# Patient Record
Sex: Female | Born: 1970 | Race: White | Hispanic: No | Smoking: Never smoker
Health system: Southern US, Community
[De-identification: ages and names within clinical notes are randomized; demographics above are authoritative.]

## PROBLEM LIST (undated history)

## (undated) DIAGNOSIS — M1632 Unilateral osteoarthritis resulting from hip dysplasia, left hip: Secondary | ICD-10-CM

## (undated) DIAGNOSIS — E7800 Pure hypercholesterolemia, unspecified: Secondary | ICD-10-CM

## (undated) DIAGNOSIS — M6740 Ganglion, unspecified site: Secondary | ICD-10-CM

## (undated) DIAGNOSIS — E119 Type 2 diabetes mellitus without complications: Secondary | ICD-10-CM

## (undated) DIAGNOSIS — J302 Other seasonal allergic rhinitis: Secondary | ICD-10-CM

## (undated) DIAGNOSIS — R030 Elevated blood-pressure reading, without diagnosis of hypertension: Secondary | ICD-10-CM

## (undated) DIAGNOSIS — M1990 Unspecified osteoarthritis, unspecified site: Secondary | ICD-10-CM

## (undated) DIAGNOSIS — M2550 Pain in unspecified joint: Secondary | ICD-10-CM

## (undated) DIAGNOSIS — B351 Tinea unguium: Secondary | ICD-10-CM

## (undated) DIAGNOSIS — I10 Essential (primary) hypertension: Secondary | ICD-10-CM

## (undated) DIAGNOSIS — M1631 Unilateral osteoarthritis resulting from hip dysplasia, right hip: Secondary | ICD-10-CM

## (undated) DIAGNOSIS — L989 Disorder of the skin and subcutaneous tissue, unspecified: Secondary | ICD-10-CM

## (undated) DIAGNOSIS — T7840XA Allergy, unspecified, initial encounter: Secondary | ICD-10-CM

## (undated) DIAGNOSIS — E282 Polycystic ovarian syndrome: Secondary | ICD-10-CM

## (undated) HISTORY — DX: Disorder of the skin and subcutaneous tissue, unspecified: L98.9

## (undated) HISTORY — DX: Pure hypercholesterolemia, unspecified: E78.00

## (undated) HISTORY — DX: Ganglion, unspecified site: M67.40

## (undated) HISTORY — DX: Polycystic ovarian syndrome: E28.2

## (undated) HISTORY — DX: Elevated blood-pressure reading, without diagnosis of hypertension: R03.0

## (undated) HISTORY — DX: Tinea unguium: B35.1

## (undated) HISTORY — PX: NO PAST SURGERIES: SHX2092

## (undated) HISTORY — DX: Pain in unspecified joint: M25.50

## (undated) HISTORY — PX: TONSILLECTOMY: SUR1361

---
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
## 2005-09-16 ENCOUNTER — Ambulatory Visit: Payer: Self-pay | Admitting: Family Medicine

## 2006-08-12 ENCOUNTER — Ambulatory Visit: Payer: Self-pay | Admitting: Family Medicine

## 2006-08-23 ENCOUNTER — Ambulatory Visit: Payer: Self-pay | Admitting: Family Medicine

## 2006-08-23 LAB — CONVERTED CEMR LAB
ALT: 24 units/L (ref 0–40)
AST: 20 units/L (ref 0–37)
Albumin: 3.8 g/dL (ref 3.5–5.2)
Alkaline Phosphatase: 38 units/L — ABNORMAL LOW (ref 39–117)
CO2: 30 meq/L (ref 19–32)
Cholesterol: 257 mg/dL (ref 0–200)
HDL: 34.4 mg/dL — ABNORMAL LOW (ref >39.0)
Total Bilirubin: 1.3 mg/dL — ABNORMAL HIGH (ref 0.3–1.2)
Total CHOL/HDL Ratio: 7.5
Total Protein: 6.4 g/dL (ref 6.0–8.3)
Triglycerides: 419 mg/dL (ref 0–149)

## 2006-10-15 ENCOUNTER — Ambulatory Visit (HOSPITAL_COMMUNITY): Admission: RE | Admit: 2006-10-15 | Discharge: 2006-10-15 | Payer: Self-pay | Admitting: Obstetrics and Gynecology

## 2006-10-15 ENCOUNTER — Encounter (INDEPENDENT_AMBULATORY_CARE_PROVIDER_SITE_OTHER): Payer: Self-pay | Admitting: Specialist

## 2006-12-10 ENCOUNTER — Ambulatory Visit: Payer: Self-pay | Admitting: Family Medicine

## 2006-12-10 DIAGNOSIS — E7800 Pure hypercholesterolemia, unspecified: Secondary | ICD-10-CM

## 2007-01-20 ENCOUNTER — Ambulatory Visit: Payer: Self-pay | Admitting: Family Medicine

## 2007-01-20 LAB — CONVERTED CEMR LAB
Glucose, Urine, Semiquant: NEGATIVE
Protein, U semiquant: NEGATIVE
Specific Gravity, Urine: 1.02
Urobilinogen, UA: 0.2
pH: 6.5

## 2007-01-21 ENCOUNTER — Encounter: Payer: Self-pay | Admitting: Family Medicine

## 2007-01-24 LAB — CONVERTED CEMR LAB
Cholesterol: 266 mg/dL (ref 0–200)
Direct LDL: 115.1 mg/dL
Total CHOL/HDL Ratio: 7.8
VLDL: 104 mg/dL — ABNORMAL HIGH (ref 0–40)

## 2007-10-10 ENCOUNTER — Telehealth: Payer: Self-pay | Admitting: Family Medicine

## 2008-09-20 LAB — CONVERTED CEMR LAB
Pap Smear: NORMAL
Pap Smear: NORMAL

## 2009-01-29 ENCOUNTER — Ambulatory Visit: Payer: Self-pay | Admitting: Family Medicine

## 2009-01-29 DIAGNOSIS — M6740 Ganglion, unspecified site: Secondary | ICD-10-CM | POA: Insufficient documentation

## 2009-01-29 DIAGNOSIS — L989 Disorder of the skin and subcutaneous tissue, unspecified: Secondary | ICD-10-CM | POA: Insufficient documentation

## 2009-01-29 DIAGNOSIS — B351 Tinea unguium: Secondary | ICD-10-CM | POA: Insufficient documentation

## 2009-01-31 ENCOUNTER — Telehealth: Payer: Self-pay | Admitting: Family Medicine

## 2009-02-12 ENCOUNTER — Telehealth: Payer: Self-pay | Admitting: Family Medicine

## 2009-03-13 ENCOUNTER — Encounter: Payer: Self-pay | Admitting: Family Medicine

## 2009-04-25 ENCOUNTER — Encounter: Admission: RE | Admit: 2009-04-25 | Discharge: 2009-04-25 | Payer: Self-pay | Admitting: Obstetrics and Gynecology

## 2009-05-09 ENCOUNTER — Ambulatory Visit: Payer: Self-pay | Admitting: Family Medicine

## 2009-05-09 DIAGNOSIS — M2550 Pain in unspecified joint: Secondary | ICD-10-CM

## 2009-05-15 LAB — CONVERTED CEMR LAB
ALT: 19 units/L (ref 0–35)
Albumin: 4.1 g/dL (ref 3.5–5.2)
Alkaline Phosphatase: 47 units/L (ref 39–117)
Bilirubin, Direct: 0.1 mg/dL (ref 0.0–0.3)
CO2: 29 meq/L (ref 19–32)
Calcium: 8.7 mg/dL (ref 8.4–10.5)
Chloride: 103 meq/L (ref 96–112)
Creatinine, Ser: 0.7 mg/dL (ref 0.4–1.2)
Direct LDL: 142.8 mg/dL
Glucose, Bld: 85 mg/dL (ref 70–99)
Rhuematoid fact SerPl-aCnc: 20 intl units/mL (ref 0.0–20.0)
Sodium: 139 meq/L (ref 135–145)
Total Protein: 6.6 g/dL (ref 6.0–8.3)

## 2009-12-04 ENCOUNTER — Encounter (INDEPENDENT_AMBULATORY_CARE_PROVIDER_SITE_OTHER): Payer: Self-pay | Admitting: *Deleted

## 2009-12-27 ENCOUNTER — Ambulatory Visit: Payer: Self-pay | Admitting: Family Medicine

## 2010-01-02 LAB — CONVERTED CEMR LAB
Cholesterol: 255 mg/dL — ABNORMAL HIGH (ref 0–200)
HDL: 36.5 mg/dL — ABNORMAL LOW (ref >39.00)
Total CHOL/HDL Ratio: 7
VLDL: 134.4 mg/dL — ABNORMAL HIGH (ref 0.0–40.0)

## 2010-04-22 LAB — HM MAMMOGRAPHY: HM Mammogram: NORMAL

## 2010-05-20 ENCOUNTER — Ambulatory Visit: Payer: Self-pay | Admitting: Family Medicine

## 2010-05-20 DIAGNOSIS — M25519 Pain in unspecified shoulder: Secondary | ICD-10-CM

## 2010-05-20 DIAGNOSIS — M25511 Pain in right shoulder: Secondary | ICD-10-CM | POA: Insufficient documentation

## 2010-05-20 DIAGNOSIS — I10 Essential (primary) hypertension: Secondary | ICD-10-CM

## 2010-05-20 DIAGNOSIS — J069 Acute upper respiratory infection, unspecified: Secondary | ICD-10-CM | POA: Insufficient documentation

## 2010-07-22 NOTE — Assessment & Plan Note (Signed)
Summary: CPX/JRR   Vital Signs:  Patient profile:   40 year old female Height:      70 inches Weight:      290.4 pounds BMI:     41.82 Temp:     98.5 degrees F oral Pulse rate:   76 / minute Pulse rhythm:   regular BP sitting:   140 / 100  (left arm) Cuff size:   large  Vitals Entered By: Benny Lennert CMA Duncan Dull) (May 20, 2010 11:23 AM)  History of Present Illness: Chief complaint chronic health management.  The patient is here for annual wellness exam and preventative care.    BP very elevated today..previously well controlled. BP was elevated when saw her OB for her CPX.  Does not check at home. Has been trying to eat better in last year..no weight loss. Walk 3 times a week.  Adopted ..so no known Fhx of HTN  IN last day started facial pressure, nasal discharge, no fever. No ST.  No cough., no SOB.  Family with illness as well.  This AM headache..none prior..no dizziness. Not using any OTC med. Not taking allergies medicaitons.   Continues to have joint pain this year... hips and upper neck pain.  Most bothersome in right shoulder... began in last 3 months. Pain most with abduction, no sig pain wih int and ext rotation. Has history of neck injury in high school... neck stiffness...ever since.  No numbness, tingling in right arm. Using daily ibuprofen.   Last year basic rheum panel was neg.  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  Lipid Management History:      Positive NCEP/ATP III risk factors include HDL cholesterol less than 40.  Negative NCEP/ATP III risk factors include female age less than 46 years old and non-tobacco-user status.        The patient expresses understanding of adjunctive measures for cholesterol lowering.  Adjunctive measures started by the patient include aerobic exercise, omega-3 supplements, limit alcohol consumpton, and weight reduction.  Comments: L Improved from last year.   Triglycerides worse. .   Preventive Screening-Counseling &  Management  Alcohol-Tobacco     Smoking Status: never  Caffeine-Diet-Exercise     Does Patient Exercise: yes  Problems Prior to Update: 1)  Pain in Joint, Multiple Sites  (ICD-719.49) 2)  Dermatophytosis of Nail  (ICD-110.1) 3)  Ganglion of Tendon Sheath  (ICD-727.42) 4)  Skin Lesion  (ICD-709.9) 5)  Hypercholesterolemia, Pure  (ICD-272.0)  Current Medications (verified): 1)  Fenofibrate 160 Mg Tabs (Fenofibrate) .... Take 1 Tablet By Mouth Once A Day  Allergies (verified): No Known Drug Allergies  Past History:  Past medical, surgical, family and social histories (including risk factors) reviewed, and no changes noted (except as noted below).  Family History: Reviewed history and no changes required. Adopted.   Social History: Reviewed history and no changes required. Never Smoked Alcohol use-no Regular exercise-yes Smoking Status:  never Does Patient Exercise:  yes  Review of Systems General:  Denies fatigue and fever. CV:  Denies chest pain or discomfort. Resp:  Denies shortness of breath. GI:  Denies abdominal pain. GU:  Denies dysuria.  Physical Exam  General:  obese appearing female inNAd  Head:  frontal ttp, no maxillary sinus ttp Eyes:  No corneal or conjunctival inflammation noted. EOMI. Perrla. Funduscopic exam benign, without hemorrhages, exudates or papilledema. Vision grossly normal. Ears:  clear fluid B TMs  Nose:  nasal discharge, no mucosal pallor.   Mouth:  MMM Neck:  no carotid  bruit or thyromegaly no cervical or supraclavicular lymphadenopathy   decrease lateral motion..no vertebral ttp  neg Spurling Lungs:  Normal respiratory effort, chest expands symmetrically. Lungs are clear to auscultation, no crackles or wheezes. Heart:  Normal rate and regular rhythm. S1 and S2 normal without gallop, murmur, click, rub or other extra sounds. Abdomen:  Bowel sounds positive,abdomen soft and non-tender without masses, organomegaly or hernias  noted. Msk:  Right shoulder.. latearl ttp over bursa, pain with passive and active abduction, noe with other movements NEg drop arm test, neg impingement sign  Fulll ROM in B hips..no current pain.  Pulses:  R and L posterior tibial pulses are full and equal bilaterally  Extremities:  No clubbing, cyanosis, edema, or deformity noted in hands, wrists, ankles  Neurologic:  No cranial nerve deficits noted. Station and gait are normal. Plantar reflexes are down-going bilaterally. DTRs are symmetrical throughout. Sensory, motor and coordinative functions appear intact.   Impression & Recommendations:  Problem # 1:  ELEVATED BLOOD PRESSURE WITHOUT DIAGNOSIS OF HYPERTENSION (ICD-796.2) Follow BPs at home. Follow up in 2-4 weeks with measurements. Likely needs work up and [employer]. Encouraged exercise, weight loss, healthy eating habits.   Problem # 2:  SHOULDER PAIN, RIGHT (ICD-719.41) Likely due to bursitis...treat with NSAID, ice massage and ROM exercises.   Problem # 3:  PAIN IN JOINT, MULTIPLE SITES (ICD-719.49) LAst year neg RF... pain is in larger jints..not characteristic of RA. More likely arthrits in neck due to past injury and arthritis in hips due to obesity.  Trial of diclofenac as above.   Problem # 4:  URI (ICD-465.9)  Instructed on symptomatic treatment. Call if symptoms persist or worsen.   No celar sing of bacterial sinusitis.   Problem # 5:  HYPERCHOLESTEROLEMIA, PURE (ICD-272.0) LDL improved by triglycerides dangerously high.. on fenofibrate.Marland Kitchenadd fish oil. Diet and lifestyle changes. Recheck in 3 months.  Her updated medication list for this problem includes:    Fenofibrate 160 Mg Tabs (Fenofibrate) .Marland Kitchen... Take 1 tablet by mouth once a day  Complete Medication List: 1)  Fenofibrate 160 Mg Tabs (Fenofibrate) .... Take 1 tablet by mouth once a day  Other Orders: Admin 1st Vaccine ([address]) Flu Vaccine 70yrs + (60454)  Lipid Assessment/Plan:      Based on NCEP/ATP III,  the patient's risk factor category is "0-1 risk factors".  The patient's lipid goals are as follows: Total cholesterol goal is 200; LDL cholesterol goal is 160; HDL cholesterol goal is 40; Triglyceride goal is 150.  Her LDL cholesterol goal has been met.    Patient Instructions: 1)  Follow BP at home.. record measurements.Marland Kitchengoal <140/90. 2)  Follow up appt for BP  appt in 2-4 weeks.  3)   In meantime..work on weight loss, low salt diet, increase exercise. 4)   Fish oil 2000 mg divided daily...EPA and DHA. 5)  Return in 3 months for lipids Dx 272.0. 6)   Stop ibuprofen.  7)  Start diclofenac 75 mg by mouth two times a day. 8)   Gentle exercsie of right shoulder. 9)   Ice massage of right shoulder.  10)  Take OTC..mucinex , no decongestant 11)   Start back on nasal saline irrigation. 12)  Restart claritin daily.   Orders Added: 1)  Admin 1st Vaccine [90471] 2)  Flu Vaccine 40yrs + [90658] 3)  Est. Patient Level IV [09811]    Current Allergies (reviewed today): No known allergies  Flu Vaccine Consent Questions     Do you have a history of severe allergic reactions to this vaccine? no    Any prior history of allergic reactions to egg and/or gelatin? no    Do you have a sensitivity to the preservative Thimersol? no    Do you have a past history of Guillan-Barre Syndrome? no    Do you currently have an acute febrile illness? no    Have you ever had a severe reaction to latex? no    Vaccine information given and explained to patient? yes    Are you currently pregnant? no    Lot Number:[id number]   Exp Date:12/20/2010   Site Given  Left Deltoid IM .lbflu1 Last Flu Vaccine:  given (05/09/2009 9:29:43 AM) Flu Vaccine Result Date:  05/20/2010 Flu Vaccine Result:  given Flu Vaccine Next Due:  1 yr Last PAP:  normal (09/20/2008 10:38:34 AM) PAP Result Date:  04/19/2010 PAP Result:  normal PAP Next Due:  1 yr Last Mammogram:  normal (04/22/2009 10:38:34  AM) Mammogram Result Date:  04/22/2010 Mammogram Result:  normal Mammogram Next Due:  1 yr  Appended Document: Orders Update Medications Added FISH OIL 1000 MG CAPS (OMEGA-3 FATTY ACIDS) 1 tab by mouth two times a day DICLOFENAC SODIUM 75 MG TBEC (DICLOFENAC SODIUM) 1 tab by mouth two times a day as needed joint pain.          Clinical Lists Changes  Medications: Added new medication of FISH OIL 1000 MG CAPS (OMEGA-3 FATTY ACIDS) 1 tab by mouth two times a day Added new medication of DICLOFENAC SODIUM 75 MG TBEC (DICLOFENAC SODIUM) 1 tab by mouth two times a day as needed joint pain. - Signed Rx of DICLOFENAC SODIUM 75 MG TBEC (DICLOFENAC SODIUM) 1 tab by mouth two times a day as needed joint pain.;  #30 x 0;  Signed;  Entered by: Kerby Nora MD;  Authorized by: Kerby Nora MD;  Method used: Electronically to [id number] [address], [place], [place]  [address], Ph: [phone], Fax: [phone]    Prescriptions: DICLOFENAC SODIUM 75 MG TBEC (DICLOFENAC SODIUM) 1 tab by mouth two times a day as needed joint pain.  #30 x 0   Entered and Authorized by:   Kerby Nora MD   Signed by:   Kerby Nora MD on 05/20/2010   Method used:   Electronically to        [id number] (retail)       [address]       [place], [place]  [address]       Ph: [phone]       Fax: [phone]   RxID:   [id number]

## 2010-07-22 NOTE — Letter (Signed)
Summary: [place] No Show Letter  [place] [facility]  [address] [place], [place] [address]   Phone: [phone]  Fax: [phone]    12/04/2009 MRN: [id number]  [facility] [address] [place], [place]  [address]   Dear Kathy Mccarthy,   Our records indicate that you missed your scheduled appointment with __Lab__________________ on __6.15.11__________.  Please contact this office to reschedule your appointment as soon as possible.  It is important that you keep your scheduled appointments with your physician, so we can provide you the best care possible.  Please be advised that there may be a charge for "no show" appointments.    Sincerely,   [place] [facility]

## 2010-08-20 ENCOUNTER — Telehealth: Payer: Self-pay | Admitting: Family Medicine

## 2010-08-28 NOTE — Progress Notes (Signed)
Summary: diclofenac  Phone Note Refill Request Message from:  [employer] on August 20, 2010 3:12 PM  Refills Requested: Medication #1:  DICLOFENAC SODIUM 75 MG TBEC 1 tab by mouth two times a day as needed joint pain..   Supply Requested: 1 month [facility] [address]   Method Requested: Electronic Initial call taken by: Benny Lennert CMA (AAMA),  August 20, 2010 3:12 PM    Prescriptions: DICLOFENAC SODIUM 75 MG TBEC (DICLOFENAC SODIUM) 1 tab by mouth two times a day as needed joint pain.  #30 Tablet x 0   Entered and Authorized by:   Kerby Nora MD   Signed by:   Kerby Nora MD on 08/22/2010   Method used:   Electronically to        [id number] ([facility]       [address]       [place], [place]  [address]       Ph: [phone]       Fax: [phone]   RxID:   [id number]

## 2010-11-07 NOTE — Op Note (Signed)
NAMEBELISSA, Kathy Mccarthy                 ACCOUNT NO.:  [id number]   MEDICAL RECORD NO.:  [id number]          PATIENT TYPE:  AMB   LOCATION:  SDC                           FACILITY:  [facility]   PHYSICIAN:  Zelphia Cairo, MD    DATE OF BIRTH:  15-Jul-1970   DATE OF PROCEDURE:  10/15/2006  DATE OF DISCHARGE:                               OPERATIVE REPORT   PREOPERATIVE DIAGNOSIS:  Atypical glandular cells of undetermined  significance.   POSTOPERATIVE DIAGNOSIS:  Atypical glandular cells of undetermined  significance.   PROCEDURE:  IUD removal, colposcopy, endocervical curettage,  hysteroscopy, with D&C.   SURGEON:  Zelphia Cairo, M.D.   ASSISTANT:  None.   ANESTHESIA:  General.   SPECIMENS:  1. ECC.  2. EMC.   ESTIMATED BLOOD LOSS:  Minimal.   COMPLICATIONS:  None.   CONDITION:  Stable to recovery room.   PROCEDURE:  The patient was taken to the operating room, where general  anesthesia was obtained. She was placed in the dorsal lithotomy position  using Allen stirrups. A bivalve speculum was placed in the vagina, and a  ring forceps was used to grasp the IUD which was then removed.  The  cervix was then covered in acetic acid.  Colposcope  was then focused.  No acetowhite changes were noted.  An endocervical curettage was then  performed.   The speculum was then removed. The patient was prepped and draped in a  sterile fashion. Speculum was placed in the vagina, and a single-tooth  tenaculum was used to grasp the anterior lip of the cervix.  The cervix  was easily dilated using Pratt dilators.  The hysteroscope was then  inserted, and the uterine cavity was visualized.  The left ostia  appeared normal.  The right ostia could not be visualized.  The fundus  of the uterus appeared to have some opaque stippling, otherwise appeared  normal.  There was a small polyp noted on the anterior uterine wall.  The hysteroscope  was then removed.  A curettage was then performed.  One  percent  Xylocaine was then used to provide local anesthesia on the cervix.  The  single-tooth tenaculum and speculum were removed, and the patient was  taken to the recovery room in stable condition.      Zelphia Cairo, MD  Electronically Signed     GA/MEDQ  D:  10/15/2006  T:  10/15/2006  Job:  [id number]

## 2011-03-12 ENCOUNTER — Encounter: Payer: Self-pay | Admitting: Family Medicine

## 2011-03-13 ENCOUNTER — Encounter: Payer: Self-pay | Admitting: Family Medicine

## 2011-03-13 ENCOUNTER — Ambulatory Visit (INDEPENDENT_AMBULATORY_CARE_PROVIDER_SITE_OTHER): Payer: Managed Care, Other (non HMO) | Admitting: Family Medicine

## 2011-03-13 VITALS — BP 180/130 | HR 68 | Temp 98.2°F | Ht 69.0 in | Wt 277.2 lb

## 2011-03-13 DIAGNOSIS — I10 Essential (primary) hypertension: Secondary | ICD-10-CM

## 2011-03-13 LAB — CBC WITH DIFFERENTIAL/PLATELET
Basophils Relative: 0.7 % (ref 0.0–3.0)
Eosinophils Relative: 1.5 % (ref 0.0–5.0)
HCT: 45.2 % (ref 36.0–46.0)
Hemoglobin: 15.2 g/dL — ABNORMAL HIGH (ref 12.0–15.0)
MCV: 92.7 fl (ref 78.0–100.0)
Monocytes Absolute: 0.3 10*3/uL (ref 0.1–1.0)
Neutro Abs: 3 10*3/uL (ref 1.4–7.7)
Neutrophils Relative %: 58.9 % (ref 43.0–77.0)
RBC: 4.88 Mil/uL (ref 3.87–5.11)
WBC: 5.1 10*3/uL (ref 4.5–10.5)

## 2011-03-13 LAB — LIPID PANEL
Cholesterol: 255 mg/dL — ABNORMAL HIGH (ref 0–200)
HDL: 38.8 mg/dL — ABNORMAL LOW (ref >39.00)
Total CHOL/HDL Ratio: 7
Triglycerides: 307 mg/dL — ABNORMAL HIGH (ref 0.0–149.0)
VLDL: 61.4 mg/dL — ABNORMAL HIGH (ref 0.0–40.0)

## 2011-03-13 LAB — COMPREHENSIVE METABOLIC PANEL
AST: 22 U/L (ref 0–37)
Alkaline Phosphatase: 46 U/L (ref 39–117)
BUN: 12 mg/dL (ref 6–23)
Calcium: 9 mg/dL (ref 8.4–10.5)
Creatinine, Ser: 0.8 mg/dL (ref 0.4–1.2)
Total Bilirubin: 1.5 mg/dL — ABNORMAL HIGH (ref 0.3–1.2)

## 2011-03-13 MED ORDER — CHLORTHALIDONE 25 MG PO TABS: 25.0000 mg | ORAL_TABLET | Freq: Every day | ORAL | Status: DC

## 2011-03-13 NOTE — Patient Instructions (Addendum)
Your blood pressure is too high! Start blood pressure medicine called chlorthalidone 25mg  daily. Blood work today, EKG today. Return in 2 weeks for recheck blood pressure with myself or Dr. Leonard Schwartz. Limit salt intake, more potassium (fruits and vegetables). Get plenty of water to drink.

## 2011-03-13 NOTE — Progress Notes (Signed)
  Subjective:    Patient ID: Kathy Mccarthy, female    DOB: Nov 12, 1970, 40 y.o.   MRN: [id number]  HPI CC: feeling bad  1 wk h/o feeling ill, feels blood pressure elevated.  Has wave of nausea, mild dull HA, pulsing body when sitting down resting.  Lots of stress recently.  Feels "vision closing in" when watching TV, not otherwise.  Not sleeping well (very unusual for pt).  + h/o allergies, recent flare.  Decreased appetite.  Did have HTN ~15 yrs ago, doesn't remember what med she was on.  No fevers/chills, chest pain or tightness, SOB, leg swelling.  No floaters or flashing lights.  No eye pain or redness.  No dizziness.  No recent cold sxs, cough, abd pain, vomiting, blood in stool or urine, BM changes.  Mammogram oct 2011 normal.  CPE here 04/2010.  Had elevated bp then as well.  Fasting today.  BP Readings from Last 3 Encounters:  03/13/11 160/108  05/20/10 140/100  05/09/09 120/78    Wt Readings from Last 3 Encounters:  03/13/11 277 lb 4 oz (125.76 kg)  05/20/10 290 lb 6.4 oz (131.725 kg)  05/09/09 288 lb 6.4 oz (130.817 kg)  unsure if weight loss recent or over long period of time.  Review of Systems Per HPI    Objective:   Physical Exam  Nursing note and vitals reviewed. Constitutional: She appears well-developed and well-nourished. No distress.  HENT:  Head: Normocephalic and atraumatic.  Mouth/Throat: Oropharynx is clear and moist. No oropharyngeal exudate.  Eyes: Conjunctivae and EOM are normal. Pupils are equal, round, and reactive to light. No scleral icterus.  Neck: Normal range of motion. Neck supple. Carotid bruit is not present.  Cardiovascular: Normal rate, regular rhythm, normal heart sounds and intact distal pulses.   No murmur heard. Pulmonary/Chest: Effort normal and breath sounds normal. No respiratory distress. She has no wheezes. She has no rales.  Abdominal: Soft. Bowel sounds are normal. She exhibits no distension and no mass. There is no tenderness. There  is no rebound and no guarding.       No abd/renal bruit  Lymphadenopathy:    She has no cervical adenopathy.  Skin: Skin is warm and dry. No rash noted.  Psychiatric: She has a normal mood and affect.          Assessment & Plan:

## 2011-03-14 NOTE — Assessment & Plan Note (Signed)
Hypertensive urgency. EKG - NSR.   LAD.  No ST/T changes, normal intervals. Check blood work today, start chlorthalidone 25mg  daily.   Given elevation, return in 2 wks for f/u. Discussed red flags to seek urgent care.

## 2011-03-27 ENCOUNTER — Ambulatory Visit (INDEPENDENT_AMBULATORY_CARE_PROVIDER_SITE_OTHER): Payer: 59 | Admitting: Family Medicine

## 2011-03-27 ENCOUNTER — Encounter: Payer: Self-pay | Admitting: Family Medicine

## 2011-03-27 VITALS — BP 140/80 | HR 92 | Temp 98.7°F | Ht 69.0 in | Wt 278.1 lb

## 2011-03-27 DIAGNOSIS — R7309 Other abnormal glucose: Secondary | ICD-10-CM

## 2011-03-27 DIAGNOSIS — E7800 Pure hypercholesterolemia, unspecified: Secondary | ICD-10-CM

## 2011-03-27 DIAGNOSIS — Z23 Encounter for immunization: Secondary | ICD-10-CM

## 2011-03-27 DIAGNOSIS — I10 Essential (primary) hypertension: Secondary | ICD-10-CM

## 2011-03-27 DIAGNOSIS — R7303 Prediabetes: Secondary | ICD-10-CM

## 2011-03-27 MED ORDER — CHLORTHALIDONE 25 MG PO TABS: 50.0000 mg | ORAL_TABLET | Freq: Every day | ORAL | Status: DC

## 2011-03-27 NOTE — Assessment & Plan Note (Signed)
Decrease carbs and portion size. Info given.

## 2011-03-27 NOTE — Assessment & Plan Note (Signed)
Improved control, but not at goal. Increase chlorthalidone to 50 mg daily. Lab eval was nml. Encouraged exercise, weight loss, healthy eating habits.

## 2011-03-27 NOTE — Patient Instructions (Addendum)
Schedule CPX in 3 months with fasting labs in 3 months as well. Work on starting exercise ... Goal 3-5 times a week. Increase omega 3 fatty acid: DHA, ALA, EPA...3000-4000 mg divided daily.  Work on low cholesterol diet per [employer]. Increase chlorthalidone to 50 mg daily.Molli Knock to split dose if needed as long as no increase in bedtime urination.

## 2011-03-27 NOTE — Assessment & Plan Note (Addendum)
Poor control... She will work aggressively on lifestyle changes, info given... Increase fish oil. Recehck in 3 months. May need med at that time.

## 2011-03-27 NOTE — Progress Notes (Signed)
  Subjective:    Patient ID: Kathy Mccarthy, female    DOB: Nov 20, 1970, 40 y.o.   MRN: [id number]  HPI  39 year old female  Seen 2 weeks ago by Dr. Reece Agar for Hypertensive urgency.  Was feeling overall ill. EKG - NSR. LAD. No ST/T changes, normal intervals.  Cbc, tsh, CMET  nml except cholesterol was high, started chlorthalidone 25mg  daily.   Hypertension:  Still feling bad in PM, feels great during end of day. Using medication without problems or lightheadedness: No  Chest pain with exertion:None Edema:None Short of breath:None Average home BPs: not checking at home. Other issues:  Elevated Cholesterol: not on any medication at this time. LDL not at goal <130, trig and HDL not at goal. Diet compliance:Portion control issues, loves carbs, likes veggies Exercise:None Other complaints:  Prediabetes: poor lifestyle.     Review of Systems  Constitutional: Negative for fever and fatigue.  HENT: Negative for ear pain.   Eyes: Negative for pain.  Respiratory: Negative for chest tightness and shortness of breath.   Cardiovascular: Negative for chest pain, palpitations and leg swelling.  Gastrointestinal: Negative for abdominal pain.  Genitourinary: Negative for dysuria.       Objective:   Physical Exam  Constitutional: Vital signs are normal. She appears well-developed and well-nourished. She is cooperative.  Non-toxic appearance. She does not appear ill. No distress.       obese  HENT:  Head: Normocephalic.  Right Ear: Hearing, tympanic membrane, external ear and ear canal normal. Tympanic membrane is not erythematous, not retracted and not bulging.  Left Ear: Hearing, tympanic membrane, external ear and ear canal normal. Tympanic membrane is not erythematous, not retracted and not bulging.  Nose: No mucosal edema or rhinorrhea. Right sinus exhibits no maxillary sinus tenderness and no frontal sinus tenderness. Left sinus exhibits no maxillary sinus tenderness and no frontal sinus  tenderness.  Mouth/Throat: Uvula is midline, oropharynx is clear and moist and mucous membranes are normal.  Eyes: Conjunctivae, EOM and lids are normal. Pupils are equal, round, and reactive to light. No foreign bodies found.  Neck: Trachea normal and normal range of motion. Neck supple. Carotid bruit is not present. No mass and no thyromegaly present.  Cardiovascular: Normal rate, regular rhythm, S1 normal, S2 normal, normal heart sounds, intact distal pulses and normal pulses.  Exam reveals no gallop and no friction rub.   No murmur heard. Pulmonary/Chest: Effort normal and breath sounds normal. Not tachypneic. No respiratory distress. She has no decreased breath sounds. She has no wheezes. She has no rhonchi. She has no rales.  Abdominal: Soft. Normal appearance and bowel sounds are normal. There is no tenderness.  Neurological: She is alert.  Skin: Skin is warm, dry and intact. No rash noted.  Psychiatric: Her speech is normal and behavior is normal. Judgment and thought content normal. Her mood appears not anxious. Cognition and memory are normal. She does not exhibit a depressed mood.          Assessment & Plan:

## 2011-08-03 ENCOUNTER — Other Ambulatory Visit: Payer: Self-pay | Admitting: Family Medicine

## 2011-11-17 ENCOUNTER — Other Ambulatory Visit: Payer: Self-pay | Admitting: Family Medicine

## 2012-05-04 ENCOUNTER — Other Ambulatory Visit: Payer: Self-pay | Admitting: Family Medicine

## 2012-05-05 NOTE — Telephone Encounter (Signed)
Pt due for appt CPX vs follow up. Refill until then.

## 2012-05-06 ENCOUNTER — Other Ambulatory Visit: Payer: Self-pay | Admitting: *Deleted

## 2012-05-06 MED ORDER — CHLORTHALIDONE 25 MG PO TABS: 50.0000 mg | ORAL_TABLET | Freq: Every day | ORAL | Status: DC

## 2012-05-06 NOTE — Telephone Encounter (Signed)
Pt out of med; pt request call back [phone] re: refill.

## 2012-08-12 ENCOUNTER — Ambulatory Visit (INDEPENDENT_AMBULATORY_CARE_PROVIDER_SITE_OTHER): Payer: PRIVATE HEALTH INSURANCE | Admitting: Family Medicine

## 2012-08-12 ENCOUNTER — Encounter: Payer: Self-pay | Admitting: Family Medicine

## 2012-08-12 VITALS — BP 140/90 | HR 89 | Temp 98.4°F | Ht 69.0 in | Wt 294.5 lb

## 2012-08-12 DIAGNOSIS — I10 Essential (primary) hypertension: Secondary | ICD-10-CM

## 2012-08-12 MED ORDER — CHLORTHALIDONE 25 MG PO TABS: 50.0000 mg | ORAL_TABLET | Freq: Every day | ORAL | Status: DC

## 2012-08-12 NOTE — Assessment & Plan Note (Signed)
Due for re-eval. 

## 2012-08-12 NOTE — Progress Notes (Signed)
  Subjective:    Patient ID: Kathy Mccarthy, female    DOB: 1970-09-26, 42 y.o.   MRN: [id number]  HPI Hypertension:  Poor control today on chlorthalidone. Using medication without problems or lightheadedness: None Chest pain with exertion: None Edema:None Short of breath:None Average home BPs: Increased stress in last 6 weeks.. 130/90 Other issues:  Prediabetes:  Elevated Cholesterol:  Due for re-eval. Lab Results  Component Value Date   CHOL 255* 03/13/2011   HDL 38.80* 03/13/2011   LDLDIRECT 151.9 03/13/2011   TRIG 307.0* 03/13/2011   CHOLHDL 7 03/13/2011   Diet compliance: Moderate Exercise: 3 times a week, walking. Wt Readings from Last 3 Encounters:  08/12/12 294 lb 8 oz (133.584 kg)  03/27/11 278 lb 1.9 oz (126.154 kg)  03/13/11 277 lb 4 oz (125.76 kg)    Other complaints:  Sinus pressure and congestion x 1 day. No fever.   No cough. Post nasal drip.   Last CPX in 01/2012... Pa nml, mammogram nml  Uptodate with vaccines.    Review of Systems  Constitutional: Negative for fever and fatigue.  HENT: Positive for congestion and postnasal drip.   Respiratory: Negative for cough and shortness of breath.   Cardiovascular: Negative for chest pain and leg swelling.  Gastrointestinal: Negative for abdominal pain.  Genitourinary: Negative for dysuria.       Objective:   Physical Exam  Constitutional: Vital signs are normal. She appears well-developed and well-nourished. She is cooperative.  Non-toxic appearance. She does not appear ill. No distress.  obese  HENT:  Head: Normocephalic.  Right Ear: Hearing, tympanic membrane, external ear and ear canal normal. Tympanic membrane is not erythematous, not retracted and not bulging.  Left Ear: Hearing, tympanic membrane, external ear and ear canal normal. Tympanic membrane is not erythematous, not retracted and not bulging.  Nose: Mucosal edema and rhinorrhea present. Right sinus exhibits no maxillary sinus tenderness and no  frontal sinus tenderness. Left sinus exhibits no maxillary sinus tenderness and no frontal sinus tenderness.  Mouth/Throat: Uvula is midline, oropharynx is clear and moist and mucous membranes are normal.  Eyes: Conjunctivae, EOM and lids are normal. Pupils are equal, round, and reactive to light. No foreign bodies found.  Neck: Trachea normal and normal range of motion. Neck supple. Carotid bruit is not present. No mass and no thyromegaly present.  Cardiovascular: Normal rate, regular rhythm, S1 normal, S2 normal, normal heart sounds, intact distal pulses and normal pulses.  Exam reveals no gallop and no friction rub.   No murmur heard. Pulmonary/Chest: Effort normal and breath sounds normal. Not tachypneic. No respiratory distress. She has no decreased breath sounds. She has no wheezes. She has no rhonchi. She has no rales.  Abdominal: Soft. Normal appearance and bowel sounds are normal. There is no tenderness.  Neurological: She is alert.  Skin: Skin is warm, dry and intact. No rash noted.  Psychiatric: Her speech is normal and behavior is normal. Judgment and thought content normal. Her mood appears not anxious. Cognition and memory are normal. She does not exhibit a depressed mood.          Assessment & Plan:

## 2012-08-12 NOTE — Patient Instructions (Addendum)
Follow BP at home, get a cuff. Call in 2 week with BP measurement to determine if we need to increase medication. Goal < 140/90.  Work on stress redfuction. For sinus symptoms... mucinex and nasal saline irrigation.  Work on  exercise, weight loss, healthy eating habits. Return for fasting labs in the next few weeks.

## 2012-08-12 NOTE — Assessment & Plan Note (Signed)
Borderline control. Encouraged exercise, weight loss, healthy eating habits. Follow BPs at home.   Contnue current dose of chlorthatlidone Call with measurements in 2 weeks.

## 2012-09-09 ENCOUNTER — Other Ambulatory Visit: Payer: Self-pay | Admitting: Family Medicine

## 2012-09-14 ENCOUNTER — Other Ambulatory Visit: Payer: PRIVATE HEALTH INSURANCE

## 2012-09-27 ENCOUNTER — Other Ambulatory Visit: Payer: PRIVATE HEALTH INSURANCE

## 2013-02-10 ENCOUNTER — Ambulatory Visit: Payer: PRIVATE HEALTH INSURANCE | Admitting: Family Medicine

## 2013-04-02 ENCOUNTER — Other Ambulatory Visit: Payer: Self-pay | Admitting: Family Medicine

## 2013-05-05 ENCOUNTER — Ambulatory Visit: Payer: PRIVATE HEALTH INSURANCE | Admitting: Family Medicine

## 2013-05-25 ENCOUNTER — Ambulatory Visit: Payer: PRIVATE HEALTH INSURANCE | Admitting: Family Medicine

## 2013-06-06 ENCOUNTER — Encounter: Payer: Self-pay | Admitting: Family Medicine

## 2013-06-06 ENCOUNTER — Ambulatory Visit (INDEPENDENT_AMBULATORY_CARE_PROVIDER_SITE_OTHER): Payer: 59 | Admitting: Family Medicine

## 2013-06-06 VITALS — BP 150/100 | HR 89 | Temp 98.6°F | Ht 69.0 in | Wt 306.0 lb

## 2013-06-06 DIAGNOSIS — E7800 Pure hypercholesterolemia, unspecified: Secondary | ICD-10-CM

## 2013-06-06 DIAGNOSIS — I10 Essential (primary) hypertension: Secondary | ICD-10-CM

## 2013-06-06 DIAGNOSIS — Z23 Encounter for immunization: Secondary | ICD-10-CM

## 2013-06-06 DIAGNOSIS — R7309 Other abnormal glucose: Secondary | ICD-10-CM

## 2013-06-06 DIAGNOSIS — R7303 Prediabetes: Secondary | ICD-10-CM

## 2013-06-06 MED ORDER — LOSARTAN POTASSIUM-HCTZ 50-12.5 MG PO TABS: 1.0000 | ORAL_TABLET | Freq: Every day | ORAL | Status: DC

## 2013-06-06 NOTE — Assessment & Plan Note (Signed)
Stop chlorthalidone.. change to losartan/HCTZ. Eval risk factors and end organ damage.  Follow BP at home. Follow up in 2 weeks.. Likely will need to increase medication dose.

## 2013-06-06 NOTE — Progress Notes (Signed)
Pre-visit discussion using our clinic review tool. No additional management support is needed unless otherwise documented below in the visit note.  

## 2013-06-06 NOTE — Addendum Note (Signed)
Addended by: Damita Lack on: 06/06/2013 09:57 AM   Modules accepted: Orders

## 2013-06-06 NOTE — Assessment & Plan Note (Signed)
Due for re-eval. 

## 2013-06-06 NOTE — Patient Instructions (Addendum)
Stop chlorthalidone. Start losartan/HCTZ in AM.  Get BP cuff, follow BP at home .Marland Kitchen Goal <140/90.  Return ASAP for fasting labs.  Follow BP in 2 weeks on new med. Exercise 3-5 times a week, work on weight loss, and healthy eating.

## 2013-06-06 NOTE — Assessment & Plan Note (Signed)
Due for re-eval. Encouraged exercise, weight loss, healthy eating habits.  

## 2013-06-06 NOTE — Progress Notes (Signed)
42 year old female presents for follow up HTN.  Hypertension: Poor control today on chlorthalidone alone, never called with BP measurements from home. BP Readings from Last 3 Encounters:  06/06/13 150/100  08/12/12 140/90  03/27/11 140/80    Wt Readings from Last 3 Encounters:  06/06/13 306 lb (138.801 kg)  08/12/12 294 lb 8 oz (133.584 kg)  03/27/11 278 lb 1.9 oz (126.154 kg)  Using medication without problems or lightheadedness: some nightmares with chlorthalidone. Chest pain with exertion: None  Edema:None  Short of breath:None  Average home BPs: Not checking at home, but feels like BP elevated. Other issues:   Prediabetes:  Due for re-eval.  Elevated Cholesterol: Due for re-eval. NEVER CAME BACK FOR LABS AFTER LAST APPT. Lab Results  Component Value Date   CHOL 255* 03/13/2011   HDL 38.80* 03/13/2011   LDLDIRECT 151.9 03/13/2011   TRIG 307.0* 03/13/2011   CHOLHDL 7 03/13/2011   Diet compliance: Moderate  Exercise: kickboxing  Time a week    Review of Systems  Constitutional: Negative for fever and fatigue.   Respiratory: Negative for cough and shortness of breath.  Cardiovascular: Negative for chest pain and leg swelling.  Gastrointestinal: Negative for abdominal pain.  Genitourinary: Negative for dysuria.  Objective:   Physical Exam  Constitutional: Vital signs are normal. She appears well-developed and well-nourished. She is cooperative. Non-toxic appearance. She does not appear ill. No distress.  obese  HENT:  Head: Normocephalic.  Right Ear: Hearing, tympanic membrane, external ear and ear canal normal. Tympanic membrane is not erythematous, not retracted and not bulging.  Left Ear: Hearing, tympanic membrane, external ear and ear canal normal. Tympanic membrane is not erythematous, not retracted and not bulging.  Nose: Right sinus exhibits no maxillary sinus tenderness and no frontal sinus tenderness. Left sinus exhibits no maxillary sinus tenderness and no  frontal sinus tenderness.  Mouth/Throat: Uvula is midline, oropharynx is clear and moist and mucous membranes are normal.  Eyes: Conjunctivae, EOM and lids are normal. Pupils are equal, round, and reactive to light. No foreign bodies found.  Neck: Trachea normal and normal range of motion. Neck supple. Carotid bruit is not present. No mass and no thyromegaly present.  Cardiovascular: Normal rate, regular rhythm, S1 normal, S2 normal, normal heart sounds, intact distal pulses and normal pulses. Exam reveals no gallop and no friction rub.  No murmur heard.  Pulmonary/Chest: Effort normal and breath sounds normal. Not tachypneic. No respiratory distress. She has no decreased breath sounds. She has no wheezes. She has no rhonchi. She has no rales.  Abdominal: Soft. Normal appearance and bowel sounds are normal. There is no tenderness.  Neurological: She is alert.  Skin: Skin is warm, dry and intact. No rash noted.  Psychiatric: Her speech is normal and behavior is normal. Judgment and thought content normal. Her mood appears not anxious. Cognition and memory are normal. She does not exhibit a depressed mood.

## 2013-06-08 ENCOUNTER — Telehealth: Payer: Self-pay | Admitting: Family Medicine

## 2013-06-08 ENCOUNTER — Other Ambulatory Visit (INDEPENDENT_AMBULATORY_CARE_PROVIDER_SITE_OTHER): Payer: 59

## 2013-06-08 DIAGNOSIS — R7303 Prediabetes: Secondary | ICD-10-CM

## 2013-06-08 DIAGNOSIS — E7800 Pure hypercholesterolemia, unspecified: Secondary | ICD-10-CM

## 2013-06-08 DIAGNOSIS — I10 Essential (primary) hypertension: Secondary | ICD-10-CM

## 2013-06-08 DIAGNOSIS — R7309 Other abnormal glucose: Secondary | ICD-10-CM

## 2013-06-08 LAB — CBC WITH DIFFERENTIAL/PLATELET
Basophils Relative: 0.6 % (ref 0.0–3.0)
Eosinophils Relative: 2.9 % (ref 0.0–5.0)
HCT: 42.6 % (ref 36.0–46.0)
Lymphs Abs: 1.9 10*3/uL (ref 0.7–4.0)
MCV: 91.5 fl (ref 78.0–100.0)
Monocytes Absolute: 0.4 10*3/uL (ref 0.1–1.0)
Platelets: 158 10*3/uL (ref 150.0–400.0)
WBC: 6.2 10*3/uL (ref 4.5–10.5)

## 2013-06-08 LAB — LIPID PANEL
Cholesterol: 233 mg/dL — ABNORMAL HIGH (ref 0–200)
HDL: 37.6 mg/dL — ABNORMAL LOW (ref >39.00)
VLDL: 122.2 mg/dL — ABNORMAL HIGH (ref 0.0–40.0)

## 2013-06-08 LAB — COMPREHENSIVE METABOLIC PANEL
Alkaline Phosphatase: 35 U/L — ABNORMAL LOW (ref 39–117)
BUN: 13 mg/dL (ref 6–23)
Glucose, Bld: 144 mg/dL — ABNORMAL HIGH (ref 70–99)
Total Bilirubin: 1.2 mg/dL (ref 0.3–1.2)

## 2013-06-08 LAB — HEMOGLOBIN A1C: Hgb A1c MFr Bld: 7.3 % — ABNORMAL HIGH (ref 4.6–6.5)

## 2013-06-08 NOTE — Telephone Encounter (Signed)
Yes pt to start losartan HCTZ as instructed since kidney function nml.

## 2013-06-08 NOTE — Telephone Encounter (Signed)
Message copied by Excell Seltzer on Thu Jun 08, 2013  5:11 PM ------      Message from: Damita Lack      Created: Thu Jun 08, 2013  3:41 PM       Patient notified as instructed by telephone.  Asking if she can start the medication Dr. Ermalene Searing prescribed.  Was waiting on labs before starting.  Please Advise. ------

## 2013-06-08 NOTE — Telephone Encounter (Signed)
Left detailed message on voicemail.  

## 2013-06-20 ENCOUNTER — Ambulatory Visit (INDEPENDENT_AMBULATORY_CARE_PROVIDER_SITE_OTHER): Payer: 59 | Admitting: Family Medicine

## 2013-06-20 ENCOUNTER — Encounter: Payer: Self-pay | Admitting: Family Medicine

## 2013-06-20 VITALS — BP 142/100 | HR 90 | Temp 98.2°F | Ht 69.0 in | Wt 311.5 lb

## 2013-06-20 DIAGNOSIS — IMO0001 Reserved for inherently not codable concepts without codable children: Secondary | ICD-10-CM

## 2013-06-20 DIAGNOSIS — R7303 Prediabetes: Secondary | ICD-10-CM

## 2013-06-20 DIAGNOSIS — R7309 Other abnormal glucose: Secondary | ICD-10-CM

## 2013-06-20 DIAGNOSIS — E876 Hypokalemia: Secondary | ICD-10-CM

## 2013-06-20 DIAGNOSIS — I10 Essential (primary) hypertension: Secondary | ICD-10-CM

## 2013-06-20 DIAGNOSIS — E119 Type 2 diabetes mellitus without complications: Secondary | ICD-10-CM | POA: Insufficient documentation

## 2013-06-20 DIAGNOSIS — E7800 Pure hypercholesterolemia, unspecified: Secondary | ICD-10-CM

## 2013-06-20 LAB — HM DIABETES FOOT EXAM

## 2013-06-20 MED ORDER — LOSARTAN POTASSIUM-HCTZ 100-25 MG PO TABS: 1.0000 | ORAL_TABLET | Freq: Every day | ORAL | Status: DC

## 2013-06-20 NOTE — Assessment & Plan Note (Signed)
LDL at goal , but trigs very high. Start fish oil 2000 mg divided daily.

## 2013-06-20 NOTE — Progress Notes (Signed)
   Subjective:    Patient ID: Kathy Mccarthy, female    DOB: December 02, 1970, 42 y.o.   MRN: [id number]  HPI 2 week follow up  Hypertension:  Inadequate control on losartan/HCTZ 50/12.5 x 1 week.  Using medication without problems or lightheadedness:  Improved on medication. Chest pain with exertion: None Edema:None Short of breath:None Average home BPs: Has ordered cuff. Other issues:  Elevated Cholesterol: Poor control trigs, LDL at goal < 130. Lab Results  Component Value Date   CHOL 233* 06/08/2013   HDL 37.60* 06/08/2013   LDLDIRECT 107.5 06/08/2013   TRIG 611.0 Triglyceride is over 400; calculations on Lipids are invalid.* 06/08/2013   CHOLHDL 6 06/08/2013    Using medications without problems: Muscle aches:  Diet compliance: Exercise: Other complaints:  New diagnosis diabetes.  She reports that her diet is poor, she eats a lot of carbs.  She wishes to hold on nutrition referral at this time.  Low potassium.. Need recheck.. Likely due to previous chlorthalidone.   Review of Systems  Constitutional: Negative for fever and fatigue.  HENT: Negative for ear pain.   Eyes: Negative for pain.  Respiratory: Negative for chest tightness and shortness of breath.   Cardiovascular: Negative for chest pain, palpitations and leg swelling.  Gastrointestinal: Negative for abdominal pain.  Genitourinary: Negative for dysuria.       Objective:   Physical Exam  Constitutional: Vital signs are normal. She appears well-developed and well-nourished. She is cooperative.  Non-toxic appearance. She does not appear ill. No distress.  HENT:  Head: Normocephalic.  Right Ear: Hearing, tympanic membrane, external ear and ear canal normal. Tympanic membrane is not erythematous, not retracted and not bulging.  Left Ear: Hearing, tympanic membrane, external ear and ear canal normal. Tympanic membrane is not erythematous, not retracted and not bulging.  Nose: No mucosal edema or rhinorrhea. Right  sinus exhibits no maxillary sinus tenderness and no frontal sinus tenderness. Left sinus exhibits no maxillary sinus tenderness and no frontal sinus tenderness.  Mouth/Throat: Uvula is midline, oropharynx is clear and moist and mucous membranes are normal.  Eyes: Conjunctivae, EOM and lids are normal. Pupils are equal, round, and reactive to light. Lids are everted and swept, no foreign bodies found.  Neck: Trachea normal and normal range of motion. Neck supple. Carotid bruit is not present. No mass and no thyromegaly present.  Cardiovascular: Normal rate, regular rhythm, S1 normal, S2 normal, normal heart sounds, intact distal pulses and normal pulses.  Exam reveals no gallop and no friction rub.   No murmur heard. Pulmonary/Chest: Effort normal and breath sounds normal. Not tachypneic. No respiratory distress. She has no decreased breath sounds. She has no wheezes. She has no rhonchi. She has no rales.  Abdominal: Soft. Normal appearance and bowel sounds are normal. There is no tenderness.  Neurological: She is alert.  Skin: Skin is warm, dry and intact. No rash noted.  Psychiatric: Her speech is normal and behavior is normal. Judgment and thought content normal. Her mood appears not anxious. Cognition and memory are normal. She does not exhibit a depressed mood.   Diabetic foot exam: Normal inspection No skin breakdown No calluses  Normal DP pulses Normal sensation to light touch and monofilament Nails normal        Assessment & Plan:

## 2013-06-20 NOTE — Assessment & Plan Note (Signed)
Total visit time 30 minutes, > 50% spent counseling and cordinating patients care. Info given . Refused nutrition referral. Follow up in 3 months.

## 2013-06-20 NOTE — Patient Instructions (Addendum)
Increase  Losartan HCTZ to 100/ 25 mg daily.  Stop lab on your way out. Increase fish oil  EPA/DHA/ALA 2000-4000 mg divided daily. Follow BP at home. Work on weight loss, low carb and exercise.  Diabetes, Eating Away From Home Sometimes, you might eat in a restaurant or have meals that are prepared by someone else. You can enjoy eating out. However, the portions in restaurants may be much larger than needed. Listed below are some ideas to help you choose foods that will keep your blood glucose (sugar) in better control.  TIPS FOR EATING OUT  Know your meal plan and how many carbohydrate servings you should have at each meal. You may wish to carry a copy of your meal plan in your purse or wallet. Learn the foods included in each food group.  Make a list of restaurants near you that offer healthy choices. Take a copy of the carry-out menus to see what they offer. Then, you can plan what you will order ahead of time.  Become familiar with serving sizes by practicing them at home using measuring cups and spoons. Once you learn to recognize portion sizes, you will be able to correctly estimate the amount of total carbohydrate you are allowed to eat at the restaurant. Ask for a takeout box if the portion is more than you should have. When your food comes, leave the amount you should have on the plate, and put the rest in the takeout box before you start eating.  Plan ahead if your mealtime will be different from usual. Check with your caregiver to find out how to time meals and medicine if you are taking insulin.  Avoid high-fat foods, such as fried foods, cream sauces, high-fat salad dressings, or any added butter or margarine.  Do not be afraid to ask questions. Ask your server about the portion size, cooking methods, ingredients and if items can be substituted. Restaurants do not list all available items on the menu. You can ask for your main entree to be prepared using skim milk, oil instead of  butter or margarine, and without gravy or sauces. Ask your waiter or waitress to serve salad dressings, gravy, sauces, margarine, and sour cream on the side. You can then add the amount your meal plan suggests.  Add more vegetables whenever possible.  Avoid items that are labeled "jumbo," "giant," "deluxe," or "supersized."  You may want to split an entre with someone and order an extra side salad.  Watch for hidden calories in foods like croutons, bacon, or cheese.  Ask your server to take away the bread basket or chips from your table.  Order a dinner salad as an appetizer. You can eat most foods served in a restaurant. Some foods are better choices than others. Breads and Starches  Recommended: All kinds of bread (wheat, rye, white, oatmeal, [place], [place], raisin), hard or soft dinner rolls, frankfurter or hamburger buns, small bagels, small corn or whole-wheat flour tortillas.  Avoid: Frosted or glazed breads, butter rolls, egg or cheese breads, croissants, sweet rolls, pastries, coffee cake, glazed or frosted doughnuts, muffins. Crackers  Recommended: Animal crackers, graham, rye, saltine, oyster, and matzoth crackers. Bread sticks, melba toast, rusks, pretzels, popcorn (without fat), zwieback toast.  Avoid: High-fat snack crackers or chips. Buttered popcorn. Cereals  Recommended: Hot and cold cereals. Whole grains such as oatmeal or shredded wheat are good choices.  Avoid: Sugar-coated or granola type cereals. Potatoes/Pasta/Rice/Beans  Recommended: Order baked, boiled, or mashed potatoes, rice or  noodles without added fat, whole beans. Order gravies, butter, margarine, or sauces on the side so you can control the amount you add.  Avoid: Hash browns or fried potatoes. Potatoes, pasta, or rice prepared with cream or cheese sauce. Potato or pasta salads prepared with large amounts of dressing. Fried beans or fried rice. Vegetables  Recommended: Order steamed, baked, boiled,  or stewed vegetables without sauces or extra fat. Ask that sauce be served on the side. If vegetables are not listed on the menu, ask what is available.  Avoid: Vegetables prepared with cream, butter, or cheese sauce. Fried vegetables. Salad Bars  Recommended: Many of the vegetables at a salad bar are considered "free." Use lemon juice, vinegar, or low-calorie salad dressing (fewer than 20 calories per serving) as "free" dressings for your salad. Look for salad bar ingredients that have no added fat or sugar such as tomatoes, lettuce, cucumbers, broccoli, carrots, onions, and mushrooms.  Avoid: Prepared salads with large amounts of dressing, such as coleslaw, caesar salad, macaroni salad, bean salad, or carrot salad. Fruit  Recommended: Eat fresh fruit or fresh fruit salad without added dressing. A salad bar often offers fresh fruit choices, but canned fruit at a restaurant is usually packed in sugar or syrup.  Avoid: Sweetened canned or frozen fruits, plain or sweetened fruit juice. Fruit salads with dressing, sour cream, or sugar added to them. Meat and Meat Substitutes  Recommended: Order broiled, baked, roasted, or grilled meat, poultry, or fish. Trim off all visible fat. Do not eat the skin of poultry. The size stated on the menu is the raw weight. Meat shrinks by  in cooking (for example, 4 oz raw equals 3 oz cooked meat).  Avoid: Deep-fat fried meat, poultry, or fish. Breaded meats. Eggs  Recommended: Order soft, hard-cooked, poached, or scrambled eggs. Omelets may be okay, depending on what ingredients are added. Egg substitutes are also a good choice.  Avoid: Fried eggs, eggs prepared with cream or cheese sauce. Milk  Recommended: Order low-fat or fat-free milk according to your meal plan. Plain, nonfat yogurt or flavored yogurt with no sugar added may be used as a substitute for milk. Soy milk may also be used.  Avoid: Milk shakes or sweetened milk beverages. Soups and  Combination Foods  Recommended: Clear broth or consomm are "free" foods and may be used as an appetizer. Broth-based soups with fat removed count as a starch serving and are preferred over cream soups. Soups made with beans or split peas may be eaten but count as a starch.  Avoid: Fatty soups, soup made with cream, cheese soup. Combination foods prepared with excessive amounts of fat or with cream or cheese sauces. Desserts and Sweets  Recommended: Ask for fresh fruit. Sponge or angel food cake without icing, ice milk, no sugar added ice cream, sherbet, or frozen yogurt may fit into your meal plan occasionally.  Avoid: Pastries, puddings, pies, cakes with icing, custard, gelatin desserts. Fats and Oils  Recommended: Choose healthy fats such as olive oil, canola oil, or tub margarine, reduced fat or fat-free sour cream, cream cheese, avocado, or nuts.  Avoid: Any fats in excess of your allowed portion. Deep-fried foods or any food with a large amount of fat. Note: Ask for all fats to be served on the side, and limit your portion sizes according to your meal plan. Document Released: 06/08/2005 Document Revised: 08/31/2011 Document Reviewed: 12/27/2008 [facility] Patient Information 2014 [place], [place].  Follow up in 3 months for DM, chol  with fasting labs prior.

## 2013-06-20 NOTE — Assessment & Plan Note (Signed)
Poor control not at goal < 130/80.  Increase losartan HCTZ to 100/25. Encouraged exercise, weight loss, healthy eating habits.

## 2013-06-20 NOTE — Progress Notes (Signed)
Pre-visit discussion using our clinic review tool. No additional management support is needed unless otherwise documented below in the visit note.  

## 2013-06-20 NOTE — Assessment & Plan Note (Signed)
recheck

## 2013-06-21 LAB — BASIC METABOLIC PANEL
BUN: 13 mg/dL (ref 6–23)
CO2: 30 mEq/L (ref 19–32)
Calcium: 9.3 mg/dL (ref 8.4–10.5)
Chloride: 101 mEq/L (ref 96–112)
GFR: 103.31 mL/min (ref >60.00)
Sodium: 138 mEq/L (ref 135–145)

## 2013-06-23 ENCOUNTER — Encounter: Payer: Self-pay | Admitting: *Deleted

## 2013-07-06 LAB — HM DIABETES EYE EXAM

## 2013-09-11 ENCOUNTER — Telehealth: Payer: Self-pay | Admitting: Family Medicine

## 2013-09-11 DIAGNOSIS — E7800 Pure hypercholesterolemia, unspecified: Secondary | ICD-10-CM

## 2013-09-11 DIAGNOSIS — IMO0001 Reserved for inherently not codable concepts without codable children: Secondary | ICD-10-CM

## 2013-09-11 DIAGNOSIS — E1165 Type 2 diabetes mellitus with hyperglycemia: Principal | ICD-10-CM

## 2013-09-11 NOTE — Telephone Encounter (Signed)
Message copied by Jinny Sanders on Mon Sep 11, 2013  9:37 PM ------      Message from: Selinda Orion J      Created: Mon Sep 04, 2013 11:46 AM      Regarding: Lab orders for Tuesday, 3.24.15       Lab orders for f/u appt ------

## 2013-09-12 ENCOUNTER — Other Ambulatory Visit: Payer: 59

## 2013-09-19 ENCOUNTER — Ambulatory Visit: Payer: 59 | Admitting: Family Medicine

## 2013-12-29 ENCOUNTER — Telehealth: Payer: Self-pay | Admitting: *Deleted

## 2013-12-29 NOTE — Telephone Encounter (Signed)
Lm on pts vm requesting a call back to schedule DIABETIC BUNDLE OV-needing BP check and LDL

## 2014-01-30 ENCOUNTER — Other Ambulatory Visit: Payer: Self-pay | Admitting: Obstetrics and Gynecology

## 2014-02-01 LAB — CYTOLOGY - PAP

## 2014-06-12 ENCOUNTER — Other Ambulatory Visit: Payer: Self-pay | Admitting: Family Medicine

## 2014-08-14 ENCOUNTER — Ambulatory Visit (INDEPENDENT_AMBULATORY_CARE_PROVIDER_SITE_OTHER): Payer: 59 | Admitting: Family Medicine

## 2014-08-14 ENCOUNTER — Encounter: Payer: Self-pay | Admitting: Family Medicine

## 2014-08-14 DIAGNOSIS — E119 Type 2 diabetes mellitus without complications: Secondary | ICD-10-CM

## 2014-08-14 DIAGNOSIS — E7800 Pure hypercholesterolemia, unspecified: Secondary | ICD-10-CM

## 2014-08-14 DIAGNOSIS — I10 Essential (primary) hypertension: Secondary | ICD-10-CM

## 2014-08-14 DIAGNOSIS — Z23 Encounter for immunization: Secondary | ICD-10-CM

## 2014-08-14 LAB — COMPREHENSIVE METABOLIC PANEL
ALBUMIN: 3.9 g/dL (ref 3.5–5.2)
ALK PHOS: 36 U/L — AB (ref 39–117)
ALT: 44 U/L — AB (ref 0–35)
AST: 32 U/L (ref 0–37)
BILIRUBIN TOTAL: 1 mg/dL (ref 0.2–1.2)
BUN: 15 mg/dL (ref 6–23)
CO2: 30 mEq/L (ref 19–32)
Calcium: 9.5 mg/dL (ref 8.4–10.5)
Chloride: 101 mEq/L (ref 96–112)
Creatinine, Ser: 0.66 mg/dL (ref 0.40–1.20)
GFR: 102.78 mL/min (ref >60.00)
GLUCOSE: 115 mg/dL — AB (ref 70–99)
Potassium: 3.9 mEq/L (ref 3.5–5.1)
Sodium: 136 mEq/L (ref 135–145)
Total Protein: 6.6 g/dL (ref 6.0–8.3)

## 2014-08-14 LAB — LIPID PANEL
CHOLESTEROL: 243 mg/dL — AB (ref 0–200)
HDL: 36.5 mg/dL — ABNORMAL LOW (ref >39.00)
TRIGLYCERIDES: 424 mg/dL — AB (ref 0.0–149.0)
Total CHOL/HDL Ratio: 7

## 2014-08-14 LAB — HEMOGLOBIN A1C: HEMOGLOBIN A1C: 6.9 % — AB (ref 4.6–6.5)

## 2014-08-14 LAB — LDL CHOLESTEROL, DIRECT: Direct LDL: 128 mg/dL

## 2014-08-14 LAB — HM DIABETES FOOT EXAM

## 2014-08-14 NOTE — Assessment & Plan Note (Signed)
Well controlled. Continue current medication. Losartan HCTZ 100/25.

## 2014-08-14 NOTE — Progress Notes (Signed)
44 year old female present for management of chronic health issues. She does her CP at her GYN Dr. Julien Girt.   She reports increase in joint pain in hands, neck, shoulders, low back. Ongoing x years, worse in last year.  Less frequently in hips knees. No issue in wrist and ankles. No redness and no swelling. She has history of arthritis in her neck.  Using 5-6 tylenol twice daily.  She is adopted. ? RA in family.  Hypertension: Well controlled on losartan HCTZ 100/25. BP Readings from Last 3 Encounters:  08/14/14 128/88  06/20/13 142/100  06/06/13 150/100  Using medication without problems or lightheadedness: Improved on medication. Chest pain with exertion: None Edema:None Short of breath:None Average home BPs: Good. Other issues:   Elevated Cholesterol: Due for re-eval LDL at goal < 130. Lab Results  Component Value Date   CHOL 233* 06/08/2013   HDL 37.60* 06/08/2013   LDLDIRECT 107.5 06/08/2013   TRIG 611.0 Triglyceride is over 400; calculations on Lipids are invalid.* 06/08/2013   CHOLHDL 6 06/08/2013  Using medications without problems:NOne  Diet compliance: Working on diet. Exercise: Walking some Other complaints:  Diabetes, ? Control. Lab Results  Component Value Date   HGBA1C 7.3* 06/08/2013  She reports that her diet is poor, she eats a lot of carbs. She wishes to hold on nutrition referral at this time.  EYE EXAM  In  06/2014. No ulcers, no callus on feet.  Low potassium.. Need recheck.. Likely due to previous chlorthalidone.   Review of Systems  Constitutional: Negative for fever and fatigue.  HENT: Negative for ear pain.  Eyes: Negative for pain.  Respiratory: Negative for chest tightness and shortness of breath.  Cardiovascular: Negative for chest pain, palpitations and leg swelling.  Gastrointestinal: Negative for abdominal pain.  Genitourinary: Negative for dysuria.       Objective:   Physical Exam   Constitutional: Vital signs are normal. She appears well-developed and well-nourished. She is cooperative. Non-toxic appearance. She does not appear ill. No distress.  HENT:  Head: Normocephalic.  Right Ear: Hearing, tympanic membrane, external ear and ear canal normal. Tympanic membrane is not erythematous, not retracted and not bulging.  Left Ear: Hearing, tympanic membrane, external ear and ear canal normal. Tympanic membrane is not erythematous, not retracted and not bulging.  Nose: No mucosal edema or rhinorrhea. Right sinus exhibits no maxillary sinus tenderness and no frontal sinus tenderness. Left sinus exhibits no maxillary sinus tenderness and no frontal sinus tenderness.  Mouth/Throat: Uvula is midline, oropharynx is clear and moist and mucous membranes are normal.  Eyes: Conjunctivae, EOM and lids are normal. Pupils are equal, round, and reactive to light. Lids are everted and swept, no foreign bodies found.  Neck: Trachea normal and normal range of motion. Neck supple. Carotid bruit is not present. No mass and no thyromegaly present.  Cardiovascular: Normal rate, regular rhythm, S1 normal, S2 normal, normal heart sounds, intact distal pulses and normal pulses. Exam reveals no gallop and no friction rub.  No murmur heard. Pulmonary/Chest: Effort normal and breath sounds normal. Not tachypneic. No respiratory distress. She has no decreased breath sounds. She has no wheezes. She has no rhonchi. She has no rales.  Abdominal: Soft. Normal appearance and bowel sounds are normal. There is no tenderness.  Neurological: She is alert.  Skin: Skin is warm, dry and intact. No rash noted.  Psychiatric: Her speech is normal and behavior is normal. Judgment and thought content normal. Her mood appears not anxious.  Cognition and memory are normal. She does not exhibit a depressed mood.   Diabetic foot exam: Normal inspection No skin breakdown No calluses  Normal DP pulses Normal  sensation to light touch and monofilament Nails normal   ASSESSMENT AND PLAN:   The patient's preventative maintenance and recommended screening tests for an annual wellness exam were reviewed in full today. Brought up to date unless services declined.  Counselled on the importance of diet, exercise, and its role in overall health and mortality. The patient's FH and SH was reviewed, including their home life, tobacco status, and drug and alcohol status.    Vaccine: Will given flu vaccine today, uptodate with td. DVE/PAP: GYN MAMMO: GYN  No early family history of colon cancer. Refuses HIV screen.

## 2014-08-14 NOTE — Assessment & Plan Note (Signed)
Due for re-eval. 

## 2014-08-14 NOTE — Progress Notes (Signed)
Pre visit review using our clinic review tool, if applicable. No additional management support is needed unless otherwise documented below in the visit note. 

## 2014-08-14 NOTE — Patient Instructions (Addendum)
Stop at the lab on way out.  If your labs look good we can start an anti- inflammatory like diclofenac. Work on [employer], regular exercise and weight loss.

## 2014-08-14 NOTE — Assessment & Plan Note (Signed)
Due for re-eval. Encouraged exercise, weight loss, healthy eating habits.

## 2014-08-15 ENCOUNTER — Telehealth: Payer: Self-pay | Admitting: Family Medicine

## 2014-08-15 NOTE — Telephone Encounter (Signed)
emmi emailed °

## 2014-08-28 ENCOUNTER — Telehealth: Payer: Self-pay | Admitting: Family Medicine

## 2014-08-28 MED ORDER — DICLOFENAC SODIUM 75 MG PO TBEC: 75.0000 mg | DELAYED_RELEASE_TABLET | Freq: Two times a day (BID) | ORAL | Status: DC

## 2014-08-28 NOTE — Telephone Encounter (Signed)
-----   Message from Carter Kitten, [place] sent at 08/27/2014 11:38 AM EST ----- Spoke with Joshlyn.  She really would like to work on diet, exercise and weight loss prior to starting a cholesterol medication. She did received the low cholesterol hand out I mailed her.  I recommended that she work on all these thing and plan on rechecking fasting labs in three months to see where she stands.  If cholesterol still remains high in three months, she should really consider at least the red yeast rice.  She also ask about the anti-inflammatory Dr.  Diona Browner was going to send in for her once her labs came back.  Advised Dr. Diona Browner would be back in the office on Tuesday and I would have her send in that prescription.

## 2014-08-28 NOTE — Telephone Encounter (Signed)
Kathy Mccarthy notified prescription for Diclofenac has been sent to her pharmacy.

## 2014-08-28 NOTE — Telephone Encounter (Signed)
Sent in rx for anti-inflammatory diclofenac to use prn.

## 2014-09-07 ENCOUNTER — Other Ambulatory Visit: Payer: Self-pay | Admitting: Family Medicine

## 2014-10-11 ENCOUNTER — Other Ambulatory Visit: Payer: Self-pay | Admitting: Family Medicine

## 2014-10-11 NOTE — Telephone Encounter (Signed)
Last office visit 08/14/2014.  Last refilled 08/28/2014 for #30 with 2 refills.  Ok to refill?  Ok to increase quantity to #60 since instructions are to take twice a day?

## 2014-10-26 ENCOUNTER — Other Ambulatory Visit (HOSPITAL_COMMUNITY): Payer: Self-pay | Admitting: Obstetrics and Gynecology

## 2014-10-29 LAB — CYTOLOGY - PAP

## 2014-11-29 ENCOUNTER — Telehealth: Payer: Self-pay

## 2014-11-29 NOTE — Telephone Encounter (Signed)
Pt left v/m; diclofenac 75 mg one tab twice a day is not effective for joint pain now and pt wants to know if dosage can be increased or what to do? [facility].

## 2014-12-03 NOTE — Telephone Encounter (Signed)
Left message for Shan[place]ua to call the office and schedule follow up appointment with Dr. Diona Browner.

## 2014-12-03 NOTE — Telephone Encounter (Signed)
No higher dose of diclofenac. I think we need to look into her joint pain further. Have her make an appt .Marland Kitchen We will likely do labs and possibly X-rays.

## 2014-12-17 ENCOUNTER — Other Ambulatory Visit: Payer: Self-pay

## 2015-01-19 ENCOUNTER — Other Ambulatory Visit: Payer: Self-pay | Admitting: Family Medicine

## 2015-01-20 NOTE — Telephone Encounter (Signed)
Last office visit 08/14/2014.  Last refilled 10/11/2014 for #60 with 2 refills. Ok to refill?

## 2015-02-05 ENCOUNTER — Telehealth: Payer: Self-pay | Admitting: Family Medicine

## 2015-02-05 DIAGNOSIS — E119 Type 2 diabetes mellitus without complications: Secondary | ICD-10-CM

## 2015-02-05 NOTE — Telephone Encounter (Signed)
-----   Message from Ellamae Sia sent at 01/31/2015 11:38 AM EDT ----- Regarding: Lab orders for Wednesday, 8.17.16 Lab orders for a 6 month follow up appt

## 2015-02-06 ENCOUNTER — Other Ambulatory Visit (INDEPENDENT_AMBULATORY_CARE_PROVIDER_SITE_OTHER): Payer: 59

## 2015-02-06 DIAGNOSIS — E119 Type 2 diabetes mellitus without complications: Secondary | ICD-10-CM | POA: Diagnosis not present

## 2015-02-06 LAB — COMPREHENSIVE METABOLIC PANEL
ALT: 50 U/L — ABNORMAL HIGH (ref 0–35)
AST: 50 U/L — ABNORMAL HIGH (ref 0–37)
Albumin: 3.9 g/dL (ref 3.5–5.2)
Alkaline Phosphatase: 34 U/L — ABNORMAL LOW (ref 39–117)
BUN: 14 mg/dL (ref 6–23)
CO2: 26 mEq/L (ref 19–32)
Calcium: 9.1 mg/dL (ref 8.4–10.5)
Chloride: 100 mEq/L (ref 96–112)
Creatinine, Ser: 0.74 mg/dL (ref 0.40–1.20)
GFR: 89.87 mL/min (ref >60.00)
Glucose, Bld: 152 mg/dL — ABNORMAL HIGH (ref 70–99)
Potassium: 4 mEq/L (ref 3.5–5.1)
Sodium: 135 mEq/L (ref 135–145)
Total Bilirubin: 0.9 mg/dL (ref 0.2–1.2)
Total Protein: 6.8 g/dL (ref 6.0–8.3)

## 2015-02-06 LAB — LIPID PANEL
Cholesterol: 271 mg/dL — ABNORMAL HIGH (ref 0–200)
HDL: 33.8 mg/dL — ABNORMAL LOW (ref >39.00)
Total CHOL/HDL Ratio: 8
Triglycerides: 731 mg/dL — ABNORMAL HIGH (ref 0.0–149.0)

## 2015-02-06 LAB — LDL CHOLESTEROL, DIRECT: Direct LDL: 86 mg/dL

## 2015-02-06 LAB — HEMOGLOBIN A1C: HEMOGLOBIN A1C: 7.3 % — AB (ref 4.6–6.5)

## 2015-02-12 ENCOUNTER — Ambulatory Visit (INDEPENDENT_AMBULATORY_CARE_PROVIDER_SITE_OTHER): Payer: 59 | Admitting: Family Medicine

## 2015-02-12 ENCOUNTER — Encounter: Payer: Self-pay | Admitting: Family Medicine

## 2015-02-12 VITALS — BP 126/82 | HR 103 | Temp 98.5°F | Ht 69.0 in | Wt 312.0 lb

## 2015-02-12 DIAGNOSIS — E119 Type 2 diabetes mellitus without complications: Secondary | ICD-10-CM

## 2015-02-12 DIAGNOSIS — R945 Abnormal results of liver function studies: Secondary | ICD-10-CM

## 2015-02-12 DIAGNOSIS — R7989 Other specified abnormal findings of blood chemistry: Secondary | ICD-10-CM | POA: Diagnosis not present

## 2015-02-12 DIAGNOSIS — E7800 Pure hypercholesterolemia, unspecified: Secondary | ICD-10-CM

## 2015-02-12 DIAGNOSIS — I10 Essential (primary) hypertension: Secondary | ICD-10-CM

## 2015-02-12 DIAGNOSIS — Z23 Encounter for immunization: Secondary | ICD-10-CM

## 2015-02-12 LAB — HM DIABETES FOOT EXAM

## 2015-02-12 MED ORDER — FENOFIBRATE 160 MG PO TABS: 160.0000 mg | ORAL_TABLET | Freq: Every day | ORAL | Status: DC

## 2015-02-12 NOTE — Assessment & Plan Note (Signed)
Well controlled. Continue current medication.  

## 2015-02-12 NOTE — Assessment & Plan Note (Signed)
Poor control, will get back on track with diet and weight loss. Recheck in 3 months. If not at goal start a medication.

## 2015-02-12 NOTE — Addendum Note (Signed)
Addended by: Carter Kitten on: 02/12/2015 11:56 AM   Modules accepted: Orders

## 2015-02-12 NOTE — Assessment & Plan Note (Signed)
Encouraged exercise, weight loss, healthy eating habits. Start fenofibrate daily given trig > 500.

## 2015-02-12 NOTE — Progress Notes (Signed)
44 year old female presents for 6 month DM follow up.  Hypertension: Well controlled on losartan HCTZ 100/25. BP Readings from Last 3 Encounters:  02/12/15 126/82  08/14/14 128/88  06/20/13 142/100   Using medication without problems or lightheadedness: Improved on medication. Chest pain with exertion: None Edema:None Short of breath:None Average home BPs: Good. Other issues:  Wt Readings from Last 3 Encounters:  02/12/15 312 lb (141.522 kg)  08/14/14 310 lb 4 oz (140.728 kg)  06/20/13 311 lb 8 oz (141.295 kg)     Elevated Cholesterol: LDL at goal < 130. Lab Results  Component Value Date   CHOL 271* 02/06/2015   HDL 33.80* 02/06/2015   LDLDIRECT 86.0 02/06/2015   TRIG * 02/06/2015    731.0 Triglyceride is over 400; calculations on Lipids are invalid.   CHOLHDL 8 02/06/2015  Using medications without problems:None  Diet compliance: Working on diet Exercise: No exercsie Other complaints:  Diabetes, Moderate control. Lab Results  Component Value Date   HGBA1C 7.3* 02/06/2015  She reports that her diet is poor, she eats a lot of carbs. She wishes to hold on nutrition referral at this time. EYE EXAM In 06/2014. No ulcers, no callus on feet.  Low potassium.Marland Kitchen Resolved  Elevated liver function tests: No tylenol, no ETOH  Adopted, ? Liver probs. No med that can cause.    Review of Systems  Constitutional: Negative for fever and fatigue.  HENT: Negative for ear pain.  Eyes: Negative for pain.  Respiratory: Negative for chest tightness and shortness of breath.  Cardiovascular: Negative for chest pain, palpitations and leg swelling.  Gastrointestinal: Negative for abdominal pain.  Genitourinary: Negative for dysuria.      Objective:  Physical Exam  Constitutional: Vital signs are normal. She appears well-developed and well-nourished. She is cooperative. Non-toxic appearance. She does not appear ill. No distress.  HENT:  Head:  Normocephalic.  Right Ear: Hearing, tympanic membrane, external ear and ear canal normal. Tympanic membrane is not erythematous, not retracted and not bulging.  Left Ear: Hearing, tympanic membrane, external ear and ear canal normal. Tympanic membrane is not erythematous, not retracted and not bulging.  Nose: No mucosal edema or rhinorrhea. Right sinus exhibits no maxillary sinus tenderness and no frontal sinus tenderness. Left sinus exhibits no maxillary sinus tenderness and no frontal sinus tenderness.  Mouth/Throat: Uvula is midline, oropharynx is clear and moist and mucous membranes are normal.  Eyes: Conjunctivae, EOM and lids are normal. Pupils are equal, round, and reactive to light. Lids are everted and swept, no foreign bodies found.  Neck: Trachea normal and normal range of motion. Neck supple. Carotid bruit is not present. No mass and no thyromegaly present.  Cardiovascular: Normal rate, regular rhythm, S1 normal, S2 normal, normal heart sounds, intact distal pulses and normal pulses. Exam reveals no gallop and no friction rub.  No murmur heard. Pulmonary/Chest: Effort normal and breath sounds normal. Not tachypneic. No respiratory distress. She has no decreased breath sounds. She has no wheezes. She has no rhonchi. She has no rales.  Abdominal: Soft. Normal appearance and bowel sounds are normal. There is no tenderness.  Neurological: She is alert.  Skin: Skin is warm, dry and intact. No rash noted.  Psychiatric: Her speech is normal and behavior is normal. Judgment and thought content normal. Her mood appears not anxious. Cognition and memory are normal. She does not exhibit a depressed mood.   Diabetic foot exam: Normal inspection No skin breakdown No calluses  Normal DP  pulses Normal sensation to light touch and monofilament Nails normal

## 2015-02-12 NOTE — Assessment & Plan Note (Signed)
Check hepatitis panel.  No clear cause other than likely fatty liver.  Work on low fat diet and exercise.

## 2015-02-12 NOTE — Progress Notes (Signed)
Pre visit review using our clinic review tool, if applicable. No additional management support is needed unless otherwise documented below in the visit note. 

## 2015-02-12 NOTE — Patient Instructions (Addendum)
Start fenofibrate 1 tab daily. Stop at lab on way out for  Hepatitis. Work on low fat, low carb diet and exercise.

## 2015-02-13 ENCOUNTER — Telehealth: Payer: Self-pay

## 2015-02-13 LAB — HEPATITIS PANEL, ACUTE
HCV AB: NEGATIVE
HEP B C IGM: NONREACTIVE
Hep A IgM: NONREACTIVE
Hepatitis B Surface Ag: NEGATIVE

## 2015-02-13 NOTE — Telephone Encounter (Signed)
Kathy Mccarthy notified by telephone that her Acute Hepatitis Panel was negative.

## 2015-02-13 NOTE — Telephone Encounter (Signed)
Pt left v/m returning call and requesting cb. Was unable to get into result note.

## 2015-03-15 ENCOUNTER — Other Ambulatory Visit: Payer: Self-pay | Admitting: Family Medicine

## 2015-03-20 ENCOUNTER — Other Ambulatory Visit: Payer: Self-pay | Admitting: Family Medicine

## 2015-03-20 NOTE — Telephone Encounter (Signed)
Last office visit 02/12/2015.  Last refilled 01/21/2015 for #60 with 2 refills.  Ok to refill?

## 2015-04-02 LAB — HM DIABETES EYE EXAM

## 2015-04-18 ENCOUNTER — Other Ambulatory Visit: Payer: Self-pay | Admitting: Family Medicine

## 2015-04-18 NOTE — Telephone Encounter (Signed)
Last office visit 02/12/2015.  Last refilled 01/21/2015 for #60 with 2 refills.  Ok to refill?

## 2015-05-23 ENCOUNTER — Ambulatory Visit: Payer: 59 | Admitting: Family Medicine

## 2015-07-16 ENCOUNTER — Other Ambulatory Visit: Payer: Self-pay | Admitting: Family Medicine

## 2015-07-16 NOTE — Telephone Encounter (Signed)
Last office visit 02/12/2015.  Last refilled 04/18/2015 for #60 with no refills.  Ok to refill?

## 2015-08-16 ENCOUNTER — Other Ambulatory Visit: Payer: Self-pay | Admitting: Family Medicine

## 2015-08-16 NOTE — Telephone Encounter (Signed)
Last office visit 02/12/2015.  AVS states to follow up in 3 months.  No future appointments scheduled.  Refill?

## 2015-10-23 ENCOUNTER — Other Ambulatory Visit: Payer: Self-pay | Admitting: Family Medicine

## 2015-10-23 NOTE — Telephone Encounter (Signed)
Last office visit 02/12/2015.  Last refilled 07/16/2015 for #30 with 2 refills.  Ok to refill?

## 2015-11-22 ENCOUNTER — Telehealth: Payer: Self-pay | Admitting: Family Medicine

## 2015-11-22 NOTE — Telephone Encounter (Signed)
Will you please call Kathy Mccarthy and schedule a follow up appointment with Dr. Diona Browner.

## 2015-11-22 NOTE — Telephone Encounter (Signed)
Needs follow-up

## 2015-11-22 NOTE — Telephone Encounter (Signed)
Last office visit 02/12/2015.  AVS states to follow up in 3 months.  No future apponitments.  Refill?

## 2015-11-26 NOTE — Telephone Encounter (Signed)
Left message asking pt to call office  °

## 2015-12-11 NOTE — Telephone Encounter (Signed)
Follow up 7/11

## 2015-12-31 ENCOUNTER — Encounter: Payer: Self-pay | Admitting: Family Medicine

## 2015-12-31 ENCOUNTER — Ambulatory Visit (INDEPENDENT_AMBULATORY_CARE_PROVIDER_SITE_OTHER): Payer: 59 | Admitting: Family Medicine

## 2015-12-31 VITALS — BP 112/72 | HR 81 | Temp 98.4°F | Ht 69.0 in | Wt 285.8 lb

## 2015-12-31 DIAGNOSIS — I10 Essential (primary) hypertension: Secondary | ICD-10-CM

## 2015-12-31 DIAGNOSIS — R7989 Other specified abnormal findings of blood chemistry: Secondary | ICD-10-CM | POA: Diagnosis not present

## 2015-12-31 DIAGNOSIS — E7800 Pure hypercholesterolemia, unspecified: Secondary | ICD-10-CM | POA: Diagnosis not present

## 2015-12-31 DIAGNOSIS — E119 Type 2 diabetes mellitus without complications: Secondary | ICD-10-CM

## 2015-12-31 DIAGNOSIS — R945 Abnormal results of liver function studies: Secondary | ICD-10-CM

## 2015-12-31 LAB — LIPID PANEL
CHOL/HDL RATIO: 7
CHOLESTEROL: 250 mg/dL — AB (ref 0–200)
HDL: 36.7 mg/dL — ABNORMAL LOW (ref >39.00)
NonHDL: 213.02
TRIGLYCERIDES: 240 mg/dL — AB (ref 0.0–149.0)
VLDL: 48 mg/dL — ABNORMAL HIGH (ref 0.0–40.0)

## 2015-12-31 LAB — HM DIABETES FOOT EXAM

## 2015-12-31 LAB — COMPREHENSIVE METABOLIC PANEL
ALBUMIN: 4.3 g/dL (ref 3.5–5.2)
ALT: 18 U/L (ref 0–35)
AST: 20 U/L (ref 0–37)
Alkaline Phosphatase: 24 U/L — ABNORMAL LOW (ref 39–117)
BUN: 21 mg/dL (ref 6–23)
CALCIUM: 9.8 mg/dL (ref 8.4–10.5)
CHLORIDE: 102 meq/L (ref 96–112)
CO2: 27 mEq/L (ref 19–32)
CREATININE: 0.89 mg/dL (ref 0.40–1.20)
GFR: 72.35 mL/min (ref >60.00)
Glucose, Bld: 107 mg/dL — ABNORMAL HIGH (ref 70–99)
POTASSIUM: 4.4 meq/L (ref 3.5–5.1)
Sodium: 136 mEq/L (ref 135–145)
Total Bilirubin: 0.7 mg/dL (ref 0.2–1.2)
Total Protein: 7.4 g/dL (ref 6.0–8.3)

## 2015-12-31 LAB — LDL CHOLESTEROL, DIRECT: Direct LDL: 175 mg/dL

## 2015-12-31 LAB — HEMOGLOBIN A1C: HEMOGLOBIN A1C: 6.2 % (ref 4.6–6.5)

## 2015-12-31 NOTE — Progress Notes (Signed)
45 year old female presents for 6 month DM follow up.  30 lb weight loss. She has been working healthy eating, low carb  High protein. Exercising walking, plans to go to gym. Wt Readings from Last 3 Encounters:  12/31/15 285 lb 12 oz (129.615 kg)  02/12/15 312 lb (141.522 kg)  08/14/14 310 lb 4 oz (140.728 kg)    Hypertension: Well controlled on losartan HCTZ 100/25. BP Readings from Last 3 Encounters:  12/31/15 112/72  02/12/15 126/82  08/14/14 128/88  Using medication without problems or lightheadedness: Improved on medication. Chest pain with exertion: None Edema:None Short of breath:None Average home BPs: Good. Other issues:  Wt Readings from Last 3 Encounters:  12/31/15 285 lb 12 oz (129.615 kg)  02/12/15 312 lb (141.522 kg)  08/14/14 310 lb 4 oz (140.728 kg)   Elevated Cholesterol: LDL at goal < 130. Due for re-eval on fenofibrate Lab Results  Component Value Date   CHOL 271* 02/06/2015   HDL 33.80* 02/06/2015   LDLDIRECT 86.0 02/06/2015   TRIG * 02/06/2015    731.0 Triglyceride is over 400; calculations on Lipids are invalid.   CHOLHDL 8 02/06/2015  Using medications without problems:None  Diet compliance: Working on diet Exercise: No exercsie Other complaints:  Diabetes,? Control. Due fo re-eval. If not at goal .. Plan starting a medication.  Recent Labs    Lab Results  Component Value Date   HGBA1C 7.3* 02/06/2015    EYE EXAM In 06/2014. No ulcers, no callus on feet.  Not checking.   Elevated liver function tests: No tylenol, no ETOH, neg hep panel Adopted, ? Liver probs. No med that can cause.  Due for re-eval.  Social History /Family History/Past Medical History reviewed and updated if needed.   Review of Systems  Constitutional: Negative for fever and fatigue.  HENT: Negative for ear pain.  Eyes: Negative for pain.  Respiratory: Negative for chest tightness and shortness of breath.  Cardiovascular: Negative for  chest pain, palpitations and leg swelling.  Gastrointestinal: Negative for abdominal pain.  Genitourinary: Negative for dysuria.      Objective:  Physical Exam  Constitutional: morbidly obese Vital signs are normal. She appears well-developed and well-nourished. She is cooperative. Non-toxic appearance. She does not appear ill. No distress.  HENT:  Head: Normocephalic.  Right Ear: Hearing, tympanic membrane, external ear and ear canal normal. Tympanic membrane is not erythematous, not retracted and not bulging.  Left Ear: Hearing, tympanic membrane, external ear and ear canal normal. Tympanic membrane is not erythematous, not retracted and not bulging.  Nose: No mucosal edema or rhinorrhea. Right sinus exhibits no maxillary sinus tenderness and no frontal sinus tenderness. Left sinus exhibits no maxillary sinus tenderness and no frontal sinus tenderness.  Mouth/Throat: Uvula is midline, oropharynx is clear and moist and mucous membranes are normal.  Eyes: Conjunctivae, EOM and lids are normal. Pupils are equal, round, and reactive to light. Lids are everted and swept, no foreign bodies found.  Neck: Trachea normal and normal range of motion. Neck supple. Carotid bruit is not present. No mass and no thyromegaly present.  Cardiovascular: Normal rate, regular rhythm, S1 normal, S2 normal, normal heart sounds, intact distal pulses and normal pulses. Exam reveals no gallop and no friction rub.  No murmur heard. Pulmonary/Chest: Effort normal and breath sounds normal. Not tachypneic. No respiratory distress. She has no decreased breath sounds. She has no wheezes. She has no rhonchi. She has no rales.  Abdominal: Soft. Normal appearance and bowel  sounds are normal. There is no tenderness.  Neurological: She is alert.  Skin: Skin is warm, dry and intact. No rash noted.  Psychiatric: Her speech is normal and behavior is normal. Judgment and thought content normal. Her mood appears  not anxious. Cognition and memory are normal. She does not exhibit a depressed mood.   Diabetic foot exam: Normal inspection No skin breakdown No calluses  Normal DP pulses Normal sensation to light touch and monofilament Nails normal

## 2015-12-31 NOTE — Patient Instructions (Addendum)
Keep up the great work!  Stop at lab on way out.

## 2015-12-31 NOTE — Progress Notes (Signed)
Pre visit review using our clinic review tool, if applicable. No additional management support is needed unless otherwise documented below in the visit note. 

## 2015-12-31 NOTE — Assessment & Plan Note (Signed)
Likely improved with big lifestyle canges. Due for re-eval.  Low carb diet.

## 2015-12-31 NOTE — Assessment & Plan Note (Signed)
No tylenol, no ETOH, neg hep panel Adopted, ? Liver probs. Likely fatty liver. Due for re-eval.. Likely better.

## 2015-12-31 NOTE — Assessment & Plan Note (Signed)
Trigs very high at last OV.  LDL goal < 100. Due for re-eval on fenofibrate. If much improved consider stop[ping this med.

## 2015-12-31 NOTE — Assessment & Plan Note (Signed)
Well controlled. Continue current medication.  

## 2016-01-01 ENCOUNTER — Other Ambulatory Visit: Payer: Self-pay | Admitting: Obstetrics and Gynecology

## 2016-01-01 DIAGNOSIS — R928 Other abnormal and inconclusive findings on diagnostic imaging of breast: Secondary | ICD-10-CM

## 2016-01-06 ENCOUNTER — Ambulatory Visit
Admission: RE | Admit: 2016-01-06 | Discharge: 2016-01-06 | Disposition: A | Discharge status: Home / Self Care | Payer: 59 | Source: Ambulatory Visit | Attending: Obstetrics and Gynecology | Admitting: Obstetrics and Gynecology

## 2016-01-06 DIAGNOSIS — R928 Other abnormal and inconclusive findings on diagnostic imaging of breast: Secondary | ICD-10-CM

## 2016-01-17 ENCOUNTER — Other Ambulatory Visit: Payer: Self-pay | Admitting: Family Medicine

## 2016-02-13 ENCOUNTER — Other Ambulatory Visit: Payer: Self-pay | Admitting: Family Medicine

## 2016-04-20 ENCOUNTER — Other Ambulatory Visit: Payer: Self-pay | Admitting: Family Medicine

## 2016-04-20 NOTE — Telephone Encounter (Signed)
Last office visit 12/31/2015.  Last refilled 01/17/2016 for #60 with 2 refills.  Ok to refill?

## 2016-06-18 LAB — HM DIABETES EYE EXAM

## 2016-07-20 ENCOUNTER — Other Ambulatory Visit: Payer: Self-pay | Admitting: Family Medicine

## 2016-07-20 NOTE — Telephone Encounter (Signed)
Last office visit 12/31/2015.  Last refilled 04/20/2016 for #60 with 2 refills.

## 2016-08-21 ENCOUNTER — Other Ambulatory Visit: Payer: Self-pay | Admitting: Family Medicine

## 2016-09-19 ENCOUNTER — Other Ambulatory Visit: Payer: Self-pay | Admitting: Family Medicine

## 2016-10-16 ENCOUNTER — Other Ambulatory Visit: Payer: Self-pay | Admitting: Family Medicine

## 2016-10-18 ENCOUNTER — Other Ambulatory Visit: Payer: Self-pay | Admitting: Family Medicine

## 2016-10-18 NOTE — Telephone Encounter (Signed)
Last office visit 12/31/15.  Last refilled 07/20/16 for #60 with 2 refills. Ok to refill?

## 2016-11-06 ENCOUNTER — Telehealth: Payer: Self-pay | Admitting: Family Medicine

## 2016-11-06 ENCOUNTER — Ambulatory Visit (INDEPENDENT_AMBULATORY_CARE_PROVIDER_SITE_OTHER): Payer: 59 | Admitting: Family Medicine

## 2016-11-06 ENCOUNTER — Encounter: Payer: Self-pay | Admitting: Family Medicine

## 2016-11-06 ENCOUNTER — Ambulatory Visit (INDEPENDENT_AMBULATORY_CARE_PROVIDER_SITE_OTHER)
Admission: RE | Admit: 2016-11-06 | Discharge: 2016-11-06 | Disposition: A | Discharge status: Home / Self Care | Payer: 59 | Source: Ambulatory Visit | Attending: Family Medicine | Admitting: Family Medicine

## 2016-11-06 VITALS — BP 130/90 | HR 78 | Temp 97.7°F | Ht 69.0 in | Wt 289.5 lb

## 2016-11-06 DIAGNOSIS — M542 Cervicalgia: Secondary | ICD-10-CM

## 2016-11-06 DIAGNOSIS — E119 Type 2 diabetes mellitus without complications: Secondary | ICD-10-CM

## 2016-11-06 DIAGNOSIS — I10 Essential (primary) hypertension: Secondary | ICD-10-CM | POA: Diagnosis not present

## 2016-11-06 DIAGNOSIS — M79644 Pain in right finger(s): Secondary | ICD-10-CM

## 2016-11-06 DIAGNOSIS — E7800 Pure hypercholesterolemia, unspecified: Secondary | ICD-10-CM

## 2016-11-06 LAB — COMPREHENSIVE METABOLIC PANEL
ALT: 16 U/L (ref 0–35)
AST: 15 U/L (ref 0–37)
Albumin: 4.4 g/dL (ref 3.5–5.2)
Alkaline Phosphatase: 23 U/L — ABNORMAL LOW (ref 39–117)
BUN: 20 mg/dL (ref 6–23)
CO2: 29 meq/L (ref 19–32)
CREATININE: 0.86 mg/dL (ref 0.40–1.20)
Calcium: 9.7 mg/dL (ref 8.4–10.5)
Chloride: 100 mEq/L (ref 96–112)
GFR: 74.99 mL/min (ref >60.00)
GLUCOSE: 121 mg/dL — AB (ref 70–99)
Potassium: 4.1 mEq/L (ref 3.5–5.1)
SODIUM: 136 meq/L (ref 135–145)
Total Bilirubin: 0.9 mg/dL (ref 0.2–1.2)
Total Protein: 7.2 g/dL (ref 6.0–8.3)

## 2016-11-06 LAB — LDL CHOLESTEROL, DIRECT: Direct LDL: 157 mg/dL

## 2016-11-06 LAB — HM DIABETES FOOT EXAM

## 2016-11-06 LAB — LIPID PANEL
CHOL/HDL RATIO: 8
Cholesterol: 275 mg/dL — ABNORMAL HIGH (ref 0–200)
HDL: 35.1 mg/dL — ABNORMAL LOW (ref >39.00)
NONHDL: 239.98
Triglycerides: 370 mg/dL — ABNORMAL HIGH (ref 0.0–149.0)
VLDL: 74 mg/dL — ABNORMAL HIGH (ref 0.0–40.0)

## 2016-11-06 LAB — HEMOGLOBIN A1C: Hgb A1c MFr Bld: 7.4 % — ABNORMAL HIGH (ref 4.6–6.5)

## 2016-11-06 MED ORDER — LOSARTAN POTASSIUM-HCTZ 100-25 MG PO TABS: 1.0000 | ORAL_TABLET | Freq: Every day | ORAL | 3 refills | Status: DC

## 2016-11-06 NOTE — Assessment & Plan Note (Signed)
Due for re-eval. Encouraged exercise, weight loss, healthy eating habits.

## 2016-11-06 NOTE — Assessment & Plan Note (Signed)
Acute worsening on chronic  Neck pain. Given ttp over vertebrae.. eval with CX-ray.  Continue diclofenac and start gentle ROM stretching.  No sign of disc disease given no radiation.

## 2016-11-06 NOTE — Assessment & Plan Note (Signed)
Due for re-eval. 

## 2016-11-06 NOTE — Assessment & Plan Note (Signed)
No known injury.  Likely arthritis from overuse given plays piano.  Eval with X-ray.

## 2016-11-06 NOTE — Addendum Note (Signed)
Addended by: Marchia Bond on: 11/06/2016 01:16 PM   Modules accepted: Orders

## 2016-11-06 NOTE — Patient Instructions (Addendum)
Please stop at the lab to set up to have labs drawn and to have X-rays.  We will call with results.  Work on exercise, weight loss and healthy low carb diet.

## 2016-11-06 NOTE — Progress Notes (Signed)
Subjective:    Patient ID: Kathy Mccarthy, female    DOB: Nov 21, 1970, 46 y.o.   MRN: [id number]  HPI   46 year old female presents for follow up HTN  Hypertension:  Moderate control on losartan HCTZ. BP Readings from Last 3 Encounters:  11/06/16 130/90  12/31/15 112/72  02/12/15 126/82  Using medication without problems or lightheadedness: none Chest pain with exertion:none Edema:none Short of breath: none Average home BPs: 120-130/80-90 Other issues: She is using diclofenac for joint pain.  Using every day twice a day.. No stomach irritaiton.   She reports she is having worsening pain in her joints. She has bain in neck, upper back and hips. Soreness in thumb. All worse in last month.  Has always had some neck pain since a child Sometimes neck and thumb pain wakes her up at night. Plays piano and in on computer. No swelling or redness in thumb.  Pain over central vertebrae. No radiation of pain in arms.  No numbness, no weakness.  No known falls, no MVA, no change in activity.  Has not had recent x-ray of neck. Hx of childhood fracture of neck.  She is over due for DM and chol eval.  she does not check FBS at home.  Has had yearly eye exam.   Review of Systems  Constitutional: Negative for fatigue and fever.  HENT: Negative for congestion.   Eyes: Negative for pain.  Respiratory: Negative for cough and shortness of breath.   Cardiovascular: Negative for chest pain, palpitations and leg swelling.  Gastrointestinal: Negative for abdominal pain.  Genitourinary: Negative for dysuria and vaginal bleeding.  Musculoskeletal: Positive for neck pain. Negative for back pain.  Neurological: Negative for syncope, light-headedness and headaches.  Psychiatric/Behavioral: Negative for dysphoric mood.       Objective:   Physical Exam  Constitutional: Vital signs are normal. She appears well-developed and well-nourished. She is cooperative.  Non-toxic appearance. She does  not appear ill. No distress.  Morbid obesity  HENT:  Head: Normocephalic.  Right Ear: Hearing, tympanic membrane, external ear and ear canal normal. Tympanic membrane is not erythematous, not retracted and not bulging.  Left Ear: Hearing, tympanic membrane, external ear and ear canal normal. Tympanic membrane is not erythematous, not retracted and not bulging.  Nose: No mucosal edema or rhinorrhea. Right sinus exhibits no maxillary sinus tenderness and no frontal sinus tenderness. Left sinus exhibits no maxillary sinus tenderness and no frontal sinus tenderness.  Mouth/Throat: Uvula is midline, oropharynx is clear and moist and mucous membranes are normal.  Eyes: Conjunctivae, EOM and lids are normal. Pupils are equal, round, and reactive to light. Lids are everted and swept, no foreign bodies found.  Neck: Trachea normal and normal range of motion. Neck supple. Carotid bruit is not present. No thyroid mass and no thyromegaly present.  Cardiovascular: Normal rate, regular rhythm, S1 normal, S2 normal, normal heart sounds, intact distal pulses and normal pulses.  Exam reveals no gallop and no friction rub.   No murmur heard. Pulmonary/Chest: Effort normal and breath sounds normal. No tachypnea. No respiratory distress. She has no decreased breath sounds. She has no wheezes. She has no rhonchi. She has no rales.  Abdominal: Soft. Normal appearance and bowel sounds are normal. There is no tenderness.  Musculoskeletal:       Cervical back: She exhibits decreased range of motion, tenderness and bony tenderness.       Thoracic back: Normal.       Lumbar back: Normal.  Neg spurling's   tp in right DIP joint of thumb, no redness, no swelling, no contusion  Neurological: She is alert. She displays no atrophy. No cranial nerve deficit or sensory deficit. She exhibits normal muscle tone. She displays a negative Romberg sign. Gait normal.  Skin: Skin is warm, dry and intact. No rash noted.  Psychiatric:  Her speech is normal and behavior is normal. Judgment and thought content normal. Her mood appears not anxious. Cognition and memory are normal. She does not exhibit a depressed mood.     Diabetic foot exam: Normal inspection No skin breakdown No calluses  Normal DP pulses Normal sensation to light touch and monofilament Nails normal      Assessment & Plan:

## 2016-11-06 NOTE — Assessment & Plan Note (Signed)
Well controlled. Continue current medication.  

## 2016-11-06 NOTE — Telephone Encounter (Signed)
-----   Message from Carter Kitten, [place] sent at 11/06/2016  2:21 PM EDT ----- Mal Misty notified as instructed by telephone.  She states she doesn't think she can do a MRI because she is very claustrophobic.  She would like to do the referral to the neck specialist instead.

## 2016-11-09 ENCOUNTER — Encounter: Payer: Self-pay | Admitting: *Deleted

## 2016-11-11 ENCOUNTER — Telehealth: Payer: Self-pay | Admitting: *Deleted

## 2016-11-11 NOTE — Telephone Encounter (Signed)
Spoke with Ms Doughman. She is agreeable to trying the Atorvastatin and Metformin.  Please send in prescriptions to [facility] on [address].  Follow up appointment with fasting labs prior scheduled for August 2018.

## 2016-11-11 NOTE — Telephone Encounter (Signed)
-----   Message from Jinny Sanders, MD sent at 11/09/2016 12:23 PM EDT ----- Poor control triglycerides as well as LDL. LDL is somewhat improved from previous but trig are worse. I would recommend adding a statin such as atorvastatin daily in addition to or in place of fenofibrate.. Let me know if pt agreeable.  MAil low chol diet info if needed. Diabetes poorly controlled. Recommend trial of med to treat.. Such as metformin. Let me know if pt agreeable.  Make sure she has follow up in 3 months with labs prior.

## 2016-11-12 MED ORDER — METFORMIN HCL ER (MOD) 500 MG PO TB24: 500.0000 mg | ORAL_TABLET | Freq: Every day | ORAL | 3 refills | Status: DC

## 2016-11-12 MED ORDER — ATORVASTATIN CALCIUM 20 MG PO TABS: 20.0000 mg | ORAL_TABLET | Freq: Every day | ORAL | 3 refills | Status: DC

## 2016-11-13 MED ORDER — METFORMIN HCL ER 500 MG PO TB24: 500.0000 mg | ORAL_TABLET | Freq: Every day | ORAL | 1 refills | Status: DC

## 2016-11-13 NOTE — Addendum Note (Signed)
Addended by: Carter Kitten on: 11/13/2016 03:59 PM   Modules accepted: Orders

## 2016-11-17 ENCOUNTER — Encounter: Payer: Self-pay | Admitting: Family Medicine

## 2016-11-17 NOTE — Telephone Encounter (Signed)
This patient says she has not been contacted about the ortho referral ordered 5/18... I see a note a message was left on her phone.  Please call her ASAP.  Thanks!  Amy

## 2017-01-20 ENCOUNTER — Other Ambulatory Visit: Payer: Self-pay | Admitting: Family Medicine

## 2017-01-20 NOTE — Telephone Encounter (Signed)
Last office visit 11/06/2016.  Last refilled 10/20/2016 for #60 with 2 refills.  Ok to refill?

## 2017-01-21 ENCOUNTER — Other Ambulatory Visit: Payer: Self-pay | Admitting: Family Medicine

## 2017-02-08 ENCOUNTER — Telehealth: Payer: Self-pay | Admitting: Family Medicine

## 2017-02-08 ENCOUNTER — Other Ambulatory Visit: Payer: 59

## 2017-02-08 DIAGNOSIS — E119 Type 2 diabetes mellitus without complications: Secondary | ICD-10-CM

## 2017-02-08 NOTE — Telephone Encounter (Signed)
-----   Message from Ellamae Sia sent at 02/03/2017 11:07 AM EDT ----- Regarding: Lab orders for Monday, 8.20.18 Lab order for a f/u dm appt

## 2017-02-12 ENCOUNTER — Ambulatory Visit: Payer: 59 | Admitting: Family Medicine

## 2017-02-25 ENCOUNTER — Other Ambulatory Visit (INDEPENDENT_AMBULATORY_CARE_PROVIDER_SITE_OTHER): Payer: 59

## 2017-02-25 DIAGNOSIS — E119 Type 2 diabetes mellitus without complications: Secondary | ICD-10-CM

## 2017-02-25 LAB — COMPREHENSIVE METABOLIC PANEL
ALBUMIN: 4.2 g/dL (ref 3.5–5.2)
ALT: 20 U/L (ref 0–35)
AST: 16 U/L (ref 0–37)
Alkaline Phosphatase: 26 U/L — ABNORMAL LOW (ref 39–117)
BUN: 16 mg/dL (ref 6–23)
CHLORIDE: 101 meq/L (ref 96–112)
CO2: 28 mEq/L (ref 19–32)
Calcium: 9.4 mg/dL (ref 8.4–10.5)
Creatinine, Ser: 0.77 mg/dL (ref 0.40–1.20)
GFR: 85.08 mL/min (ref >60.00)
Glucose, Bld: 156 mg/dL — ABNORMAL HIGH (ref 70–99)
POTASSIUM: 3.9 meq/L (ref 3.5–5.1)
SODIUM: 136 meq/L (ref 135–145)
Total Bilirubin: 0.6 mg/dL (ref 0.2–1.2)
Total Protein: 6.8 g/dL (ref 6.0–8.3)

## 2017-02-25 LAB — LIPID PANEL
CHOL/HDL RATIO: 7
Cholesterol: 239 mg/dL — ABNORMAL HIGH (ref 0–200)
HDL: 34.5 mg/dL — AB (ref >39.00)
NONHDL: 204.28
Triglycerides: 360 mg/dL — ABNORMAL HIGH (ref 0.0–149.0)
VLDL: 72 mg/dL — AB (ref 0.0–40.0)

## 2017-02-25 LAB — LDL CHOLESTEROL, DIRECT: LDL DIRECT: 104 mg/dL

## 2017-02-25 LAB — HEMOGLOBIN A1C: HEMOGLOBIN A1C: 7.9 % — AB (ref 4.6–6.5)

## 2017-03-02 ENCOUNTER — Encounter: Payer: Self-pay | Admitting: Family Medicine

## 2017-03-02 ENCOUNTER — Ambulatory Visit (INDEPENDENT_AMBULATORY_CARE_PROVIDER_SITE_OTHER): Payer: 59 | Admitting: Family Medicine

## 2017-03-02 VITALS — BP 114/79 | HR 87 | Temp 98.6°F | Ht 69.0 in | Wt 297.5 lb

## 2017-03-02 DIAGNOSIS — Z23 Encounter for immunization: Secondary | ICD-10-CM

## 2017-03-02 DIAGNOSIS — E119 Type 2 diabetes mellitus without complications: Secondary | ICD-10-CM | POA: Diagnosis not present

## 2017-03-02 DIAGNOSIS — M79644 Pain in right finger(s): Secondary | ICD-10-CM

## 2017-03-02 DIAGNOSIS — Z6841 Body Mass Index (BMI) 40.0 and over, adult: Secondary | ICD-10-CM

## 2017-03-02 DIAGNOSIS — I10 Essential (primary) hypertension: Secondary | ICD-10-CM

## 2017-03-02 DIAGNOSIS — E7800 Pure hypercholesterolemia, unspecified: Secondary | ICD-10-CM

## 2017-03-02 LAB — HM DIABETES FOOT EXAM

## 2017-03-02 NOTE — Assessment & Plan Note (Signed)
Could not tolerate moderate dose statin.Marland Kitchen Has improved with diet control.Lind Guest off statin for now. Encouraged exercise, weight loss, healthy eating habits.

## 2017-03-02 NOTE — Assessment & Plan Note (Signed)
Well controlled. Continue current medication.  

## 2017-03-02 NOTE — Assessment & Plan Note (Signed)
Encouraged exercise, weight loss, healthy eating habits. ? ?

## 2017-03-02 NOTE — Assessment & Plan Note (Signed)
No improvement with antiinflammatories.. 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  Worsening and unusual pain at nail bed without sign of infeciton. Neg tests for carpal tunnel.  Will refer to hand specialist.

## 2017-03-02 NOTE — Assessment & Plan Note (Signed)
Inadequate control on low dose metformin. She feels diet is poor and can improve with diet change instead of med increase. Recheck in 3 months, if not at goal increase to 2 tabs daily of metformin.

## 2017-03-02 NOTE — Patient Instructions (Addendum)
Work on low [employer], increase exercsie as able with weight loss as goal. Please stop at the front desk to set up referral.

## 2017-03-02 NOTE — Progress Notes (Signed)
Subjective:    Patient ID: Kathy Mccarthy, female    DOB: 08-01-1970, 46 y.o.   MRN: [id number]  HPI    46 year old female presents for follow up  DM.  Diabetes:   Inadequate control on metformin low dose, worsened from last check 3 months ago. Lab Results  Component Value Date   HGBA1C 7.9 (H) 02/25/2017  Using medications without difficulties: Hypoglycemic episodes: none Hyperglycemic episodes:none Feet problems: no ulcers Blood Sugars averaging: not checking eye exam within last year: yes  Hypertension:   on losartan HCTZ   BP Readings from Last 3 Encounters:  03/02/17 114/79  11/06/16 130/90  12/31/15 112/72  Using medication without problems or lightheadedness:  none Chest pain with exertion:none Edema:none Short of breath: none Average home BPs: good Other issues:  Elevated Cholesterol:  LDL almost at goal but trigs very high and HDL, fenofibrate 160 mg daily, fish oil. Lab Results  Component Value Date   CHOL 239 (H) 02/25/2017   HDL 34.50 (L) 02/25/2017   LDLDIRECT 104.0 02/25/2017   TRIG 360.0 (H) 02/25/2017   CHOLHDL 7 02/25/2017  Using medications without problems: significant SE. Muscle aches: Significant leg pain Diet compliance:too many carbs lately, has cut out eggs. Exercise:walking 4 days a week. Other complaints:   She continue  To have right thumb pain in IP and MCP joint, plays piano.  No redness, no swelling. Worsening in last several month, ongoing 6 months total. Aching into hand some, 2 nd digit.  No tingling or numbness, no weakness.  occ wakes her up at night with pain  Diclofenac twice daily for pain and inflammation in joints  Minimal  OA in IP on x-ray.  Blood pressure 114/79, pulse 87, temperature 98.6 F (37 C), temperature source Oral, height 5\' 9"  (1.753 m), weight 297 lb 8 oz (134.9 kg).  Body mass index is 43.93 kg/m. Wt Readings from Last 3 Encounters:  03/02/17 297 lb 8 oz (134.9 kg)  11/06/16 289 lb 8 oz (131.3 kg)    12/31/15 285 lb 12 oz (129.6 kg)    Review of Systems  Constitutional: Negative for fatigue and fever.  HENT: Negative for congestion.   Eyes: Negative for pain.  Respiratory: Negative for cough and shortness of breath.   Cardiovascular: Negative for chest pain, palpitations and leg swelling.  Gastrointestinal: Negative for abdominal pain.  Genitourinary: Negative for dysuria and vaginal bleeding.  Musculoskeletal: Negative for back pain.  Neurological: Negative for syncope, light-headedness and headaches.  Psychiatric/Behavioral: Negative for dysphoric mood.   Body mass index is 43.93 kg/m.      Objective:   Physical Exam  Constitutional: Vital signs are normal. She appears well-developed and well-nourished. She is cooperative.  Non-toxic appearance. She does not appear ill. No distress.  Morbid obesity  HENT:  Head: Normocephalic.  Right Ear: Hearing, tympanic membrane, external ear and ear canal normal. Tympanic membrane is not erythematous, not retracted and not bulging.  Left Ear: Hearing, tympanic membrane, external ear and ear canal normal. Tympanic membrane is not erythematous, not retracted and not bulging.  Nose: No mucosal edema or rhinorrhea. Right sinus exhibits no maxillary sinus tenderness and no frontal sinus tenderness. Left sinus exhibits no maxillary sinus tenderness and no frontal sinus tenderness.  Mouth/Throat: Uvula is midline, oropharynx is clear and moist and mucous membranes are normal.  Eyes: Pupils are equal, round, and reactive to light. Conjunctivae, EOM and lids are normal. Lids are everted and swept, no foreign bodies found.  Neck: Trachea normal and normal range of motion. Neck supple. Carotid bruit is not present. No thyroid mass and no thyromegaly present.  Cardiovascular: Normal rate, regular rhythm, S1 normal, S2 normal, normal heart sounds, intact distal pulses and normal pulses.  Exam reveals no gallop and no friction rub.   No murmur  heard. Pulmonary/Chest: Effort normal and breath sounds normal. No tachypnea. No respiratory distress. She has no decreased breath sounds. She has no wheezes. She has no rhonchi. She has no rales.  Abdominal: Soft. Normal appearance and bowel sounds are normal. There is no tenderness.  Musculoskeletal:       Cervical back: She exhibits decreased range of motion, tenderness and bony tenderness.       Thoracic back: Normal.       Lumbar back: Normal.  Neg spurling's   ttp in right DIP joint of thumb, no redness, no swelling, no contusion, very ttp at nail Neg tinel and phalen  Neurological: She is alert. She displays no atrophy. No cranial nerve deficit or sensory deficit. She exhibits normal muscle tone. She displays a negative Romberg sign. Gait normal.  Skin: Skin is warm, dry and intact. No rash noted.  Psychiatric: Her speech is normal and behavior is normal. Judgment and thought content normal. Her mood appears not anxious. Cognition and memory are normal. She does not exhibit a depressed mood.     Diabetic foot exam: Normal inspection No skin breakdown No calluses  Normal DP pulses Normal sensation to light tough and monofilament Nails normal      Assessment & Plan:

## 2017-04-25 ENCOUNTER — Other Ambulatory Visit: Payer: Self-pay | Admitting: Family Medicine

## 2017-05-05 ENCOUNTER — Other Ambulatory Visit: Payer: Self-pay | Admitting: Family Medicine

## 2017-05-06 ENCOUNTER — Other Ambulatory Visit: Payer: Self-pay | Admitting: *Deleted

## 2017-05-06 MED ORDER — DICLOFENAC SODIUM 75 MG PO TBEC: 75.0000 mg | DELAYED_RELEASE_TABLET | Freq: Two times a day (BID) | ORAL | 2 refills | Status: DC

## 2017-05-06 NOTE — Telephone Encounter (Signed)
Last office visit 03/02/2017.  Last refilled 01/21/2017 for #60 with 2 refills.  Ok to refill?

## 2017-05-17 LAB — HM PAP SMEAR

## 2017-05-20 ENCOUNTER — Other Ambulatory Visit: Payer: Self-pay | Admitting: Obstetrics and Gynecology

## 2017-05-20 DIAGNOSIS — R928 Other abnormal and inconclusive findings on diagnostic imaging of breast: Secondary | ICD-10-CM

## 2017-05-25 ENCOUNTER — Ambulatory Visit
Admission: RE | Admit: 2017-05-25 | Discharge: 2017-05-25 | Disposition: A | Discharge status: Home / Self Care | Payer: 59 | Source: Ambulatory Visit | Attending: Obstetrics and Gynecology | Admitting: Obstetrics and Gynecology

## 2017-05-25 DIAGNOSIS — R928 Other abnormal and inconclusive findings on diagnostic imaging of breast: Secondary | ICD-10-CM

## 2017-07-27 LAB — HM DIABETES EYE EXAM

## 2017-08-10 ENCOUNTER — Other Ambulatory Visit: Payer: Self-pay | Admitting: Family Medicine

## 2017-08-10 NOTE — Telephone Encounter (Signed)
Last office visit 03/02/2017.  Last refilled 05/06/2017 for #60 with 2 refills.  Ok to refill?

## 2017-09-09 ENCOUNTER — Other Ambulatory Visit: Payer: Self-pay

## 2017-09-09 ENCOUNTER — Ambulatory Visit (INDEPENDENT_AMBULATORY_CARE_PROVIDER_SITE_OTHER)
Admission: RE | Admit: 2017-09-09 | Discharge: 2017-09-09 | Disposition: A | Discharge status: Home / Self Care | Payer: Managed Care, Other (non HMO) | Source: Ambulatory Visit | Attending: Family Medicine | Admitting: Family Medicine

## 2017-09-09 ENCOUNTER — Encounter: Payer: Self-pay | Admitting: Family Medicine

## 2017-09-09 ENCOUNTER — Ambulatory Visit (INDEPENDENT_AMBULATORY_CARE_PROVIDER_SITE_OTHER): Payer: Managed Care, Other (non HMO) | Admitting: Family Medicine

## 2017-09-09 DIAGNOSIS — M79605 Pain in left leg: Secondary | ICD-10-CM

## 2017-09-09 DIAGNOSIS — M545 Low back pain, unspecified: Secondary | ICD-10-CM | POA: Insufficient documentation

## 2017-09-09 DIAGNOSIS — M79604 Pain in right leg: Secondary | ICD-10-CM | POA: Diagnosis not present

## 2017-09-09 DIAGNOSIS — M5442 Lumbago with sciatica, left side: Secondary | ICD-10-CM

## 2017-09-09 DIAGNOSIS — M5441 Lumbago with sciatica, right side: Secondary | ICD-10-CM

## 2017-09-09 MED ORDER — PREDNISONE 20 MG PO TABS: ORAL_TABLET | ORAL | 0 refills | Status: DC

## 2017-09-09 NOTE — Progress Notes (Signed)
   Subjective:    Patient ID: Kathy Mccarthy, female    DOB: June 18, 1971, 47 y.o.   MRN: [id number]  HPI   47 year old morbidly obese female presents with new onset low back pain x 2 months.   She reports she had  gradual onset of back pain.. No known injury but did have low back pain [place]. She has been sore in inner thighs, hips hurting. Pinching feeling down right leg.  She has dull ache in low back, centrally.  Leg weakness when trying to walk, no numbness.  relieved with leaning over cart at store.  Pain worse when up on feet. Back pain better with walking more but legs hurt more.   Has ben using diclofenac 75 mg BID as well as heat and ice, stretching.  Going to get massages... Helps temporarily.   She does also have some pain lateral knee.   She has history of cervicalgia.. 10/2016  Ortho.. 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  Treated with home PT. Stable control.     Review of Systems     Objective:   Physical Exam  Constitutional: Vital signs are normal. She appears well-developed and well-nourished. She is cooperative.  Non-toxic appearance. She does not appear ill. No distress.  Morbid obesity  HENT:  Head: Normocephalic.  Right Ear: Hearing, tympanic membrane, external ear and ear canal normal. Tympanic membrane is not erythematous, not retracted and not bulging.  Left Ear: Hearing, tympanic membrane, external ear and ear canal normal. Tympanic membrane is not erythematous, not retracted and not bulging.  Nose: No mucosal edema or rhinorrhea. Right sinus exhibits no maxillary sinus tenderness and no frontal sinus tenderness. Left sinus exhibits no maxillary sinus tenderness and no frontal sinus tenderness.  Mouth/Throat: Uvula is midline, oropharynx is clear and moist and mucous membranes are normal.  Eyes: Pupils are equal, round, and reactive to light. Conjunctivae, EOM and lids are normal. Lids are everted and swept, no foreign bodies found.  Neck: Trachea  normal and normal range of motion. Neck supple. Carotid bruit is not present. No thyroid mass and no thyromegaly present.  Cardiovascular: Normal rate, regular rhythm, S1 normal, S2 normal, normal heart sounds, intact distal pulses and normal pulses. Exam reveals no gallop and no friction rub.  No murmur heard. Pulmonary/Chest: Effort normal and breath sounds normal. No tachypnea. No respiratory distress. She has no decreased breath sounds. She has no wheezes. She has no rhonchi. She has no rales.  Abdominal: Soft. Normal appearance and bowel sounds are normal. There is no tenderness.  Musculoskeletal:       Lumbar back: She exhibits decreased range of motion and tenderness. She exhibits no bony tenderness.       Back:  ttp in bilateral low back, mild   neg SLR bilaterally  pain in groin bilaterally with faber's, no back pain and no anterior hip pain   Neurological: She is alert.  Skin: Skin is warm, dry and intact. No rash noted.  Psychiatric: Her speech is normal and behavior is normal. Judgment and thought content normal. Her mood appears not anxious. Cognition and memory are normal. She does not exhibit a depressed mood.          Assessment & Plan:

## 2017-09-09 NOTE — Assessment & Plan Note (Signed)
Possibly referred from back, but exam more consistent with bilateral psoas strain.  Treat with antiinflammatory and home PT.  Offered referral to PT, but she wpuld like to hold off for now.

## 2017-09-09 NOTE — Assessment & Plan Note (Signed)
Eval with X-ray to determine if degenerative changes. Symtpoms may be consistent with spinal stenosis.   Low back pain can secondary after leg pain.. So may be due instead to change in gait.

## 2017-09-09 NOTE — Patient Instructions (Signed)
We will call with X-ray results.  Start prednisone taper.  Hold diclofenac.  Start home PT for low back and legs.  Massage, heat on low back.

## 2017-09-10 ENCOUNTER — Telehealth: Payer: Self-pay | Admitting: Family Medicine

## 2017-09-10 NOTE — Telephone Encounter (Signed)
Pt is diabetic.. Most recent A1C is 7.4

## 2017-09-10 NOTE — Telephone Encounter (Signed)
Dr. Valma Cava from [facility] called in reference to request for diabetic eye exam notes sent in by [facility]. Wondering if pt is diabetic suspect or actually diabetic because pt told her previously that she is not diabetic and to remove that from her chart. She can be reached at [phone].

## 2017-09-13 NOTE — Telephone Encounter (Signed)
Dr. Haskel Khan office notified as instructed by telephone.

## 2017-09-16 ENCOUNTER — Encounter: Payer: Self-pay | Admitting: Family Medicine

## 2017-10-05 ENCOUNTER — Encounter: Payer: Self-pay | Admitting: Family Medicine

## 2017-10-05 ENCOUNTER — Ambulatory Visit: Payer: Managed Care, Other (non HMO) | Admitting: Family Medicine

## 2017-10-05 ENCOUNTER — Other Ambulatory Visit: Payer: Self-pay

## 2017-10-05 VITALS — BP 140/90 | HR 86 | Temp 98.4°F | Ht 69.0 in | Wt 292.8 lb

## 2017-10-05 DIAGNOSIS — M79605 Pain in left leg: Secondary | ICD-10-CM

## 2017-10-05 DIAGNOSIS — M5442 Lumbago with sciatica, left side: Secondary | ICD-10-CM | POA: Diagnosis not present

## 2017-10-05 DIAGNOSIS — M5441 Lumbago with sciatica, right side: Secondary | ICD-10-CM | POA: Diagnosis not present

## 2017-10-05 DIAGNOSIS — M79604 Pain in right leg: Secondary | ICD-10-CM | POA: Diagnosis not present

## 2017-10-05 MED ORDER — CYCLOBENZAPRINE HCL 10 MG PO TABS: 5.0000 mg | ORAL_TABLET | Freq: Every day | ORAL | 0 refills | Status: DC

## 2017-10-05 NOTE — Progress Notes (Signed)
Subjective:    Patient ID: Kathy Mccarthy, female    DOB: October 10, 1970, 47 y.o.   MRN: [id number]  HPI    47 year old morbidly obese female present s with continue low back pain.   She was seen on  49/2019: She reports she had  gradual onset of back pain.. No known injury but did have low back pain after [place]. She has been sore in inner thighs, hips hurting. Pinching feeling down right leg.  She has dull ache in low back, centrally.  Leg weakness when trying to walk, no numbness.  relieved with leaning over cart at store.    Felt possibly consistent with spinal stenosis   Tried diclofenac without relief  X-ray showed: IMPRESSION: No acute abnormalities. Multilevel degenerative disc disease, most severe at L5-S1. Trial of prednisone taper.. minimal improvement, only temporary  Today she reports continued pain in thighs bilaterally. Dull ache in low back.  Pain has been going on now for  3 months  Improves with walking and stretching .Marland Kitchen Pain wakes her at night.  No new numbness, no new weakness.  No loss of control of urine.  no fever.   She has also tried massage.  Using tylenol, but helps minimally.    Review of Systems  Constitutional: Negative for fatigue and fever.  HENT: Negative for congestion.   Eyes: Negative for pain.  Respiratory: Negative for cough and shortness of breath.   Cardiovascular: Negative for chest pain, palpitations and leg swelling.  Gastrointestinal: Negative for abdominal pain.  Genitourinary: Negative for dysuria and vaginal bleeding.  Musculoskeletal: Positive for back pain.  Neurological: Negative for syncope, light-headedness and headaches.  Psychiatric/Behavioral: Negative for dysphoric mood.       Objective:   Physical Exam  Constitutional: Vital signs are normal. She appears well-developed and well-nourished. She is cooperative.  Non-toxic appearance. She does not appear ill. No distress.  Morbid obesity  HENT:   Head: Normocephalic.  Right Ear: Hearing, tympanic membrane, external ear and ear canal normal. Tympanic membrane is not erythematous, not retracted and not bulging.  Left Ear: Hearing, tympanic membrane, external ear and ear canal normal. Tympanic membrane is not erythematous, not retracted and not bulging.  Nose: No mucosal edema or rhinorrhea. Right sinus exhibits no maxillary sinus tenderness and no frontal sinus tenderness. Left sinus exhibits no maxillary sinus tenderness and no frontal sinus tenderness.  Mouth/Throat: Uvula is midline, oropharynx is clear and moist and mucous membranes are normal.  Eyes: Pupils are equal, round, and reactive to light. Conjunctivae, EOM and lids are normal. Lids are everted and swept, no foreign bodies found.  Neck: Trachea normal and normal range of motion. Neck supple. Carotid bruit is not present. No thyroid mass and no thyromegaly present.  Cardiovascular: Normal rate, regular rhythm, S1 normal, S2 normal, normal heart sounds, intact distal pulses and normal pulses. Exam reveals no gallop and no friction rub.  No murmur heard. Pulmonary/Chest: Effort normal and breath sounds normal. No tachypnea. No respiratory distress. She has no decreased breath sounds. She has no wheezes. She has no rhonchi. She has no rales.  Abdominal: Soft. Normal appearance and bowel sounds are normal. There is no tenderness.  Musculoskeletal:       Lumbar back: She exhibits decreased range of motion and tenderness. She exhibits no bony tenderness.       Back:  ttp in bilateral low back, mild   neg SLR bilaterally  pain in groin bilaterally  with faber's, no back pain and no anterior hip pain  Pain over bilateral psoas muscle with stretch.   Neurological: She is alert.  Skin: Skin is warm, dry and intact. No rash noted.  Psychiatric: Her speech is normal and behavior is normal. Judgment and thought content normal. Her mood appears not anxious. Cognition and memory are  normal. She does not exhibit a depressed mood.          Assessment & Plan:

## 2017-10-05 NOTE — Patient Instructions (Addendum)
Please stop at the front desk to set up referral.  Can try muscle relaxant at night. all if not improving in 2-4 weeks for mRI back to rule out spinal stenosis. Marland Kitchen Marland Kitchen

## 2017-10-05 NOTE — Assessment & Plan Note (Signed)
Pain still most consistent with spinal stenosis, but may also be +/- psoas muscle strain bilaterally. Pt hesitant of MRI , given claustrophobia.  Minimal improvement with nSAIDS, prednisone, heat, ice, Home stretching  > 2 months.  Will refer to PT and try a trial of muscle relaxant.. If not improving consider MRI L spine.

## 2017-10-07 ENCOUNTER — Other Ambulatory Visit: Payer: Self-pay | Admitting: Family Medicine

## 2017-10-18 ENCOUNTER — Other Ambulatory Visit: Payer: Self-pay | Admitting: Family Medicine

## 2017-10-18 NOTE — Telephone Encounter (Signed)
Last office visit 10/05/2017.  Last refilled 10/05/2017 for #15 with no refills.  Ok to refill?

## 2017-10-18 NOTE — Telephone Encounter (Signed)
Pt called and states that she wants to switch the facility, pt does not want PT at [facility], call pt to advise

## 2017-10-28 ENCOUNTER — Telehealth: Payer: Self-pay

## 2017-10-28 DIAGNOSIS — M25551 Pain in right hip: Secondary | ICD-10-CM

## 2017-10-28 NOTE — Telephone Encounter (Signed)
Copied from [place]. Topic: General - Other >> Oct 28, 2017 11:00 AM Carolyn Stare wrote:  Patient PT is requesting that she have an XRAY of right hip and is asking for a order to be placed for the xray   [phone]

## 2017-10-28 NOTE — Telephone Encounter (Signed)
Pt was seen 10/05/17.Please advise.

## 2017-10-29 NOTE — Telephone Encounter (Signed)
Please call to get more info. At appt pt had no anterior hip pain.. I felt next step was MRI l spine.., please see if symptoms changed,

## 2017-10-29 NOTE — Telephone Encounter (Signed)
Will place order

## 2017-10-29 NOTE — Telephone Encounter (Signed)
Spoke with Kathy Mccarthy.  She states when her Physical Therapist starting manipulating her and stretching/bending her, he felt that her issue may be coming from her hip and causing residual back pain.  He would just feel better if we could get a hip film.  Please advise.

## 2017-10-30 ENCOUNTER — Other Ambulatory Visit: Payer: Self-pay | Admitting: Family Medicine

## 2017-11-01 ENCOUNTER — Other Ambulatory Visit: Payer: Self-pay | Admitting: Family Medicine

## 2017-11-01 ENCOUNTER — Ambulatory Visit: Admission: RE | Admit: 2017-11-01 | Payer: Managed Care, Other (non HMO) | Source: Ambulatory Visit

## 2017-11-01 NOTE — Telephone Encounter (Signed)
Last office visit 10/05/2017.  Last refilled 08/10/2017 for #60 with 2 refills.  Ok to refill?

## 2017-11-02 NOTE — Telephone Encounter (Signed)
Last office visit 10/05/2017.  Last refilled 10/19/2017 for #15 with no refills.  Ok to refill?

## 2017-11-02 NOTE — Telephone Encounter (Signed)
Pt aware she can come here for xray

## 2017-11-07 ENCOUNTER — Other Ambulatory Visit: Payer: Self-pay | Admitting: Family Medicine

## 2017-11-16 ENCOUNTER — Encounter: Payer: Self-pay | Admitting: Family Medicine

## 2017-11-16 ENCOUNTER — Other Ambulatory Visit: Payer: Self-pay | Admitting: Family Medicine

## 2017-11-16 ENCOUNTER — Ambulatory Visit (INDEPENDENT_AMBULATORY_CARE_PROVIDER_SITE_OTHER)
Admission: RE | Admit: 2017-11-16 | Discharge: 2017-11-16 | Disposition: A | Discharge status: Home / Self Care | Payer: Managed Care, Other (non HMO) | Source: Ambulatory Visit | Attending: Family Medicine | Admitting: Family Medicine

## 2017-11-16 DIAGNOSIS — M25551 Pain in right hip: Secondary | ICD-10-CM | POA: Diagnosis not present

## 2017-11-17 ENCOUNTER — Other Ambulatory Visit: Payer: Self-pay | Admitting: Orthopedic Surgery

## 2017-11-17 ENCOUNTER — Other Ambulatory Visit: Payer: Self-pay | Admitting: Family Medicine

## 2017-11-17 DIAGNOSIS — M545 Low back pain, unspecified: Secondary | ICD-10-CM

## 2017-11-17 NOTE — Telephone Encounter (Signed)
Copied from [place] [id number]. Topic: Quick Communication - Rx Refill/Question >> Nov 17, 2017  4:18 PM Wynetta Emery, [place] C wrote: Medication: cyclobenzaprine (FLEXERIL) 10 MG tablet   Has the patient contacted their pharmacy? Yes  (Agent: If no, request that the patient contact the pharmacy for the refill.) (Agent: If yes, when and what did the pharmacy advise?)  Preferred Pharmacy (with phone number or street name): [id number] [address], [place] [phone] (Phone) [phone] (Fax)      Agent: Please be advised that RX refills may take up to 3 business days. We ask that you follow-up with your pharmacy.

## 2017-11-18 ENCOUNTER — Other Ambulatory Visit: Payer: Self-pay | Admitting: Family Medicine

## 2017-11-18 NOTE — Telephone Encounter (Signed)
Duplicate Request.

## 2017-11-18 NOTE — Telephone Encounter (Signed)
Last office visit 10/05/2017.  Last refilled 11/02/2017 for #15 with no refills.  Ok to refill?

## 2017-11-18 NOTE — Telephone Encounter (Signed)
Flexeril refill Last Refill:11/02/17 # 15 Last OV: 10/05/17 PCP: Dr. Diona Browner [facility]    [address]

## 2017-11-19 ENCOUNTER — Other Ambulatory Visit: Payer: Self-pay | Admitting: Family Medicine

## 2017-11-19 MED ORDER — CYCLOBENZAPRINE HCL 10 MG PO TABS: 5.0000 mg | ORAL_TABLET | Freq: Every day | ORAL | 1 refills | Status: DC

## 2017-11-19 NOTE — Telephone Encounter (Signed)
Copied from [place] [phone]. Topic: Quick Communication - Rx Refill/Question >> Nov 19, 2017 12:33 PM Tye [place] wrote: Medication: cyclobenzaprine (FLEXERIL) 10 MG tablet [[id number]]   Has the patient contacted their pharmacy? Yes.   (Agent: If no, request that the patient contact the pharmacy for the refill.) (Agent: If yes, when and what did the pharmacy advise?)  [facility]  Agent: Please be advised that RX refills may take up to 3 business days. We ask that you follow-up with your pharmacy.

## 2017-11-21 ENCOUNTER — Telehealth: Payer: Self-pay | Admitting: Family Medicine

## 2017-11-21 DIAGNOSIS — E7800 Pure hypercholesterolemia, unspecified: Secondary | ICD-10-CM

## 2017-11-21 DIAGNOSIS — E119 Type 2 diabetes mellitus without complications: Secondary | ICD-10-CM

## 2017-11-21 NOTE — Telephone Encounter (Signed)
-----   Message from Lendon Collar, RT sent at 11/16/2017  9:38 AM EDT ----- Regarding: Lab orders for Monday, June 3rd Please enter lab orders for June 3rd. Thanks-Lauren

## 2017-11-22 ENCOUNTER — Other Ambulatory Visit (INDEPENDENT_AMBULATORY_CARE_PROVIDER_SITE_OTHER): Payer: Managed Care, Other (non HMO)

## 2017-11-22 DIAGNOSIS — E7800 Pure hypercholesterolemia, unspecified: Secondary | ICD-10-CM | POA: Diagnosis not present

## 2017-11-22 DIAGNOSIS — E119 Type 2 diabetes mellitus without complications: Secondary | ICD-10-CM | POA: Diagnosis not present

## 2017-11-22 LAB — COMPREHENSIVE METABOLIC PANEL
ALT: 22 U/L (ref 0–35)
AST: 19 U/L (ref 0–37)
Albumin: 4.7 g/dL (ref 3.5–5.2)
Alkaline Phosphatase: 31 U/L — ABNORMAL LOW (ref 39–117)
BILIRUBIN TOTAL: 0.7 mg/dL (ref 0.2–1.2)
BUN: 14 mg/dL (ref 6–23)
CHLORIDE: 100 meq/L (ref 96–112)
CO2: 26 meq/L (ref 19–32)
CREATININE: 0.81 mg/dL (ref 0.40–1.20)
Calcium: 9.7 mg/dL (ref 8.4–10.5)
GFR: 80 mL/min (ref >60.00)
GLUCOSE: 160 mg/dL — AB (ref 70–99)
Potassium: 3.8 mEq/L (ref 3.5–5.1)
Sodium: 137 mEq/L (ref 135–145)
Total Protein: 7.7 g/dL (ref 6.0–8.3)

## 2017-11-22 LAB — LDL CHOLESTEROL, DIRECT: LDL DIRECT: 163 mg/dL

## 2017-11-22 LAB — LIPID PANEL
CHOL/HDL RATIO: 9
Cholesterol: 292 mg/dL — ABNORMAL HIGH (ref 0–200)
HDL: 33.7 mg/dL — ABNORMAL LOW (ref >39.00)

## 2017-11-22 LAB — HEMOGLOBIN A1C: Hgb A1c MFr Bld: 7.5 % — ABNORMAL HIGH (ref 4.6–6.5)

## 2017-11-23 ENCOUNTER — Other Ambulatory Visit: Payer: Managed Care, Other (non HMO)

## 2017-11-24 ENCOUNTER — Other Ambulatory Visit: Payer: Self-pay | Admitting: Family Medicine

## 2017-11-24 ENCOUNTER — Ambulatory Visit
Admission: RE | Admit: 2017-11-24 | Discharge: 2017-11-24 | Disposition: A | Discharge status: Home / Self Care | Payer: Managed Care, Other (non HMO) | Source: Ambulatory Visit | Attending: Orthopedic Surgery | Admitting: Orthopedic Surgery

## 2017-11-24 DIAGNOSIS — M545 Low back pain, unspecified: Secondary | ICD-10-CM

## 2017-11-26 ENCOUNTER — Ambulatory Visit: Payer: Managed Care, Other (non HMO) | Admitting: Family Medicine

## 2017-12-17 ENCOUNTER — Other Ambulatory Visit: Payer: Self-pay | Admitting: Family Medicine

## 2018-01-06 ENCOUNTER — Other Ambulatory Visit: Payer: Self-pay | Admitting: Family Medicine

## 2018-01-06 NOTE — Telephone Encounter (Signed)
Last filled 12-01-17 #30 Last OV 10-05-17  No Future OV  [facility] [address]

## 2018-01-11 ENCOUNTER — Other Ambulatory Visit: Payer: Self-pay | Admitting: Family Medicine

## 2018-01-12 NOTE — Telephone Encounter (Signed)
Electronic refill request Last office visit 10/05/17 Last refill 12/17/17 #30 Patient cancelled last appointment for follow-up on DM No upcoming appointment scheduled.

## 2018-01-13 NOTE — Telephone Encounter (Signed)
Have pt make appt for DM follow up.Marland Kitchen Refill until then.

## 2018-01-13 NOTE — Telephone Encounter (Signed)
Called and left detailed VM for pt to call an schedule a follow up appointment. Informing her that once appointment is made a refill will be provided until then. Verified on DPR.

## 2018-01-14 NOTE — Telephone Encounter (Signed)
Copied from [place] [id number]. Topic: General - Other >> Jan 14, 2018  8:35 AM Kathy Mccarthy wrote: Reason for CRM:  Patient is calling in a request for her metFORMIN (GLUCOPHAGE-XR) 500 MG 24 hr tablet  to be refill. she stated she can not miss work and wont be available to schedule an appt with Dr.Bedsole until Sept. Due to her back issue and work schedule She is requesting a Refill if possible. She is wanting a nurse to give her a call back in regards to a decision. Please advise

## 2018-01-14 NOTE — Telephone Encounter (Signed)
I spoke with pt; pt scheduled appt on 02/04/18 at 3:15. Metformin refilled # 30 to [facility]. Pt voiced understanding.

## 2018-02-04 ENCOUNTER — Encounter: Payer: Self-pay | Admitting: Family Medicine

## 2018-02-04 ENCOUNTER — Ambulatory Visit: Payer: Managed Care, Other (non HMO) | Admitting: Family Medicine

## 2018-02-04 VITALS — BP 118/80 | HR 105 | Temp 98.5°F | Ht 69.0 in | Wt 283.5 lb

## 2018-02-04 DIAGNOSIS — E7800 Pure hypercholesterolemia, unspecified: Secondary | ICD-10-CM

## 2018-02-04 DIAGNOSIS — I10 Essential (primary) hypertension: Secondary | ICD-10-CM | POA: Diagnosis not present

## 2018-02-04 DIAGNOSIS — E119 Type 2 diabetes mellitus without complications: Secondary | ICD-10-CM

## 2018-02-04 LAB — HM DIABETES FOOT EXAM

## 2018-02-04 MED ORDER — GABAPENTIN 300 MG PO CAPS: 300.0000 mg | ORAL_CAPSULE | Freq: Every day | ORAL | 0 refills | Status: DC

## 2018-02-04 MED ORDER — DICLOFENAC SODIUM 75 MG PO TBEC: 75.0000 mg | DELAYED_RELEASE_TABLET | Freq: Two times a day (BID) | ORAL | 0 refills | Status: DC

## 2018-02-04 NOTE — Progress Notes (Signed)
Subjective:    Patient ID: Kathy Mccarthy, female    DOB: [birth year], 47 y.o.   MRN: [id number]  Diabetes  Pertinent negatives for hypoglycemia include no headaches. Pertinent negatives for diabetes include no chest pain and no fatigue.      47 year old female presents for follow up DM  Diabetes:   Due for re-eval. On metfromin 500 mg daily. Using medications without difficulties: HAs been working on low chol and low carb diet Hypoglycemic episodes: Hyperglycemic episodes: Feet problems:no ulcers Blood Sugars averaging:not checking eye exam within last year: yes   9 lb  Weight loss.Marland Kitchen ahving trouble with exercsie given back issues. Wt Readings from Last 3 Encounters:  02/04/18 283 lb 8 oz (128.6 kg)  10/05/17 292 lb 12 oz (132.8 kg)  09/09/17 292 lb 8 oz (132.7 kg)    Elevated Cholesterol: Due for re-eval. Using medications without problems: Muscle aches:  Diet compliance: Exercise: Other complaints:  Hypertension:  At goal on HCTZ and losartan Using medication without problems or lightheadedness:  none Chest pain with exertion:none Edema:none Short of breath:none Average home BPs: Good control Other issues:   Social History /Family History/Past Medical History reviewed in detail and updated in EMR if needed. Blood pressure 118/80, pulse (!) 105, temperature 98.5 F (36.9 C), temperature source Oral, height 5\' 9"  (1.753 m), weight 283 lb 8 oz (128.6 kg).   Review of Systems  Constitutional: Negative for fatigue and fever.  HENT: Negative for congestion.   Eyes: Negative for pain.  Respiratory: Negative for cough and shortness of breath.   Cardiovascular: Negative for chest pain, palpitations and leg swelling.  Gastrointestinal: Negative for abdominal pain.  Genitourinary: Negative for dysuria and vaginal bleeding.  Musculoskeletal: Negative for back pain.  Neurological: Negative for syncope, light-headedness and headaches.  Psychiatric/Behavioral: Negative for  dysphoric mood.       Objective:   Physical Exam  Constitutional: Vital signs are normal. She appears well-developed and well-nourished. She is cooperative.  Non-toxic appearance. She does not appear ill. No distress.  Morbid obesity  HENT:  Head: Normocephalic.  Right Ear: Hearing, tympanic membrane, external ear and ear canal normal. Tympanic membrane is not erythematous, not retracted and not bulging.  Left Ear: Hearing, tympanic membrane, external ear and ear canal normal. Tympanic membrane is not erythematous, not retracted and not bulging.  Nose: No mucosal edema or rhinorrhea. Right sinus exhibits no maxillary sinus tenderness and no frontal sinus tenderness. Left sinus exhibits no maxillary sinus tenderness and no frontal sinus tenderness.  Mouth/Throat: Uvula is midline, oropharynx is clear and moist and mucous membranes are normal.  Eyes: Pupils are equal, round, and reactive to light. Conjunctivae, EOM and lids are normal. Lids are everted and swept, no foreign bodies found.  Neck: Trachea normal and normal range of motion. Neck supple. Carotid bruit is not present. No thyroid mass and no thyromegaly present.  Cardiovascular: Normal rate, regular rhythm, S1 normal, S2 normal, normal heart sounds, intact distal pulses and normal pulses. Exam reveals no gallop and no friction rub.  No murmur heard. Pulmonary/Chest: Effort normal and breath sounds normal. No tachypnea. No respiratory distress. She has no decreased breath sounds. She has no wheezes. She has no rhonchi. She has no rales.  Abdominal: Soft. Normal appearance and bowel sounds are normal. There is no tenderness.  Musculoskeletal:       Cervical back: She exhibits decreased range of motion, tenderness and bony tenderness.       Thoracic back:  Normal.       Lumbar back: Normal.  Neg spurling's   ttp in right DIP joint of thumb, no redness, no swelling, no contusion, very ttp at nail Neg tinel and phalen  Neurological: She  is alert. She displays no atrophy. No cranial nerve deficit or sensory deficit. She exhibits normal muscle tone. She displays a negative Romberg sign. Gait normal.  Skin: Skin is warm, dry and intact. No rash noted.  Psychiatric: Her speech is normal and behavior is normal. Judgment and thought content normal. Her mood appears not anxious. Cognition and memory are normal. She does not exhibit a depressed mood.          Assessment & Plan:

## 2018-02-04 NOTE — Assessment & Plan Note (Signed)
Due fore re-eval on fenofibrate for extremely high trigs at last check in 11/2017

## 2018-02-04 NOTE — Assessment & Plan Note (Signed)
Due for re-eval. 

## 2018-02-04 NOTE — Patient Instructions (Addendum)
Schedule fasting labs in next week for DM and chol.

## 2018-02-04 NOTE — Assessment & Plan Note (Signed)
Well controlled. Continue current medication.  

## 2018-02-07 ENCOUNTER — Other Ambulatory Visit: Payer: Self-pay | Admitting: Family Medicine

## 2018-02-10 ENCOUNTER — Other Ambulatory Visit: Payer: Self-pay | Admitting: Family Medicine

## 2018-03-04 ENCOUNTER — Other Ambulatory Visit: Payer: Self-pay | Admitting: Family Medicine

## 2018-03-04 NOTE — Telephone Encounter (Signed)
Electronic refill request Last office visit 02/04/18 Last refill 01/07/18 #30/1

## 2018-03-11 ENCOUNTER — Other Ambulatory Visit: Payer: Self-pay | Admitting: Family Medicine

## 2018-03-17 ENCOUNTER — Other Ambulatory Visit (INDEPENDENT_AMBULATORY_CARE_PROVIDER_SITE_OTHER): Payer: Managed Care, Other (non HMO)

## 2018-03-17 DIAGNOSIS — E119 Type 2 diabetes mellitus without complications: Secondary | ICD-10-CM

## 2018-03-17 LAB — COMPREHENSIVE METABOLIC PANEL
ALBUMIN: 4.2 g/dL (ref 3.5–5.2)
ALK PHOS: 32 U/L — AB (ref 39–117)
ALT: 30 U/L (ref 0–35)
AST: 19 U/L (ref 0–37)
BILIRUBIN TOTAL: 0.7 mg/dL (ref 0.2–1.2)
BUN: 14 mg/dL (ref 6–23)
CO2: 27 mEq/L (ref 19–32)
CREATININE: 0.82 mg/dL (ref 0.40–1.20)
Calcium: 9.3 mg/dL (ref 8.4–10.5)
Chloride: 102 mEq/L (ref 96–112)
GFR: 78.78 mL/min (ref >60.00)
Glucose, Bld: 152 mg/dL — ABNORMAL HIGH (ref 70–99)
Potassium: 3.9 mEq/L (ref 3.5–5.1)
Sodium: 138 mEq/L (ref 135–145)
TOTAL PROTEIN: 7 g/dL (ref 6.0–8.3)

## 2018-03-17 LAB — LIPID PANEL
CHOLESTEROL: 261 mg/dL — AB (ref 0–200)
HDL: 36.8 mg/dL — ABNORMAL LOW (ref >39.00)
Total CHOL/HDL Ratio: 7
Triglycerides: 462 mg/dL — ABNORMAL HIGH (ref 0.0–149.0)

## 2018-03-17 LAB — LDL CHOLESTEROL, DIRECT: Direct LDL: 140 mg/dL

## 2018-03-17 LAB — HEMOGLOBIN A1C: Hgb A1c MFr Bld: 8.4 % — ABNORMAL HIGH (ref 4.6–6.5)

## 2018-03-27 ENCOUNTER — Other Ambulatory Visit: Payer: Self-pay | Admitting: Family Medicine

## 2018-04-05 ENCOUNTER — Ambulatory Visit: Payer: Managed Care, Other (non HMO) | Admitting: Family Medicine

## 2018-04-15 ENCOUNTER — Ambulatory Visit (INDEPENDENT_AMBULATORY_CARE_PROVIDER_SITE_OTHER): Payer: Managed Care, Other (non HMO) | Admitting: Family Medicine

## 2018-04-15 ENCOUNTER — Encounter: Payer: Self-pay | Admitting: Family Medicine

## 2018-04-15 VITALS — BP 130/84 | HR 102 | Temp 98.4°F | Ht 69.0 in | Wt 283.5 lb

## 2018-04-15 DIAGNOSIS — Z23 Encounter for immunization: Secondary | ICD-10-CM

## 2018-04-15 DIAGNOSIS — E119 Type 2 diabetes mellitus without complications: Secondary | ICD-10-CM | POA: Diagnosis not present

## 2018-04-15 DIAGNOSIS — E7800 Pure hypercholesterolemia, unspecified: Secondary | ICD-10-CM | POA: Diagnosis not present

## 2018-04-15 DIAGNOSIS — I10 Essential (primary) hypertension: Secondary | ICD-10-CM | POA: Diagnosis not present

## 2018-04-15 DIAGNOSIS — Z6841 Body Mass Index (BMI) 40.0 and over, adult: Secondary | ICD-10-CM

## 2018-04-15 DIAGNOSIS — D1800 Hemangioma unspecified site: Secondary | ICD-10-CM | POA: Insufficient documentation

## 2018-04-15 LAB — HM DIABETES FOOT EXAM

## 2018-04-15 MED ORDER — METFORMIN HCL ER 500 MG PO TB24: 1000.0000 mg | ORAL_TABLET | Freq: Every day | ORAL | 1 refills | Status: DC

## 2018-04-15 NOTE — Assessment & Plan Note (Signed)
Well controlled. Continue current medication.  

## 2018-04-15 NOTE — Assessment & Plan Note (Signed)
Removal planned.

## 2018-04-15 NOTE — Assessment & Plan Note (Signed)
Worsened control given multiple steroid injections. Increase metformin.. To 2 a day... May be able to decrease over time. Encouraged exercise, weight loss, healthy eating habits.

## 2018-04-15 NOTE — Assessment & Plan Note (Signed)
Worsened control.. Low cholesterol diet while lowering carbs discussed.

## 2018-04-15 NOTE — Progress Notes (Signed)
Subjective:    Patient ID: Kathy Mccarthy, female    DOB: 1970-09-24, 47 y.o.   MRN: [id number]  HPI  47 year old female presents for follow up DM  Diabetes:   She has been having steroid injection in bilateral hip and back. 3 total in last 3 months. Lab Results  Component Value Date   HGBA1C 8.4 (H) 03/17/2018  Using medications without difficulties: Hypoglycemic episodes: Hyperglycemic episodes: Feet problems:no ulcers Blood Sugars averaging: not checking eye exam within last year: 07/2017   She has been working on healthy eating and PT given joint issues. Wt Readings from Last 3 Encounters:  04/15/18 283 lb 8 oz (128.6 kg)  02/04/18 283 lb 8 oz (128.6 kg)  10/05/17 292 lb 12 oz (132.8 kg)     Elevated Cholesterol:  Poor control on fenofibrate.. She has been eating eggs. Lab Results  Component Value Date   CHOL 261 (H) 03/17/2018   HDL 36.80 (L) 03/17/2018   LDLDIRECT 140.0 03/17/2018   TRIG (H) 03/17/2018    462.0 Triglyceride is over 400; calculations on Lipids are invalid.   CHOLHDL 7 03/17/2018  Using medications without problems: Muscle aches:  Diet compliance: Great.. Low carb Exercise:none Other complaints:  Hypertension:    BP Readings from Last 3 Encounters:  04/15/18 130/84  02/04/18 118/80  10/05/17 140/90  Using medication without problems or lightheadedness: none Chest pain with exertion:none Edema:none Short of breath:none Average home BPs: Other issues:   Review of Systems  Constitutional: Negative for fatigue and fever.  HENT: Negative for congestion.   Eyes: Negative for pain.  Respiratory: Negative for cough and shortness of breath.   Cardiovascular: Negative for chest pain, palpitations and leg swelling.  Gastrointestinal: Negative for abdominal pain.  Genitourinary: Negative for dysuria and vaginal bleeding.  Musculoskeletal: Negative for back pain.  Neurological: Negative for syncope, light-headedness and headaches.    Psychiatric/Behavioral: Negative for dysphoric mood.       Objective:   Physical Exam  Constitutional: Vital signs are normal. She appears well-developed and well-nourished. She is cooperative.  Non-toxic appearance. She does not appear ill. No distress.  Morbid obesity  HENT:  Head: Normocephalic.  Right Ear: Hearing, tympanic membrane, external ear and ear canal normal. Tympanic membrane is not erythematous, not retracted and not bulging.  Left Ear: Hearing, tympanic membrane, external ear and ear canal normal. Tympanic membrane is not erythematous, not retracted and not bulging.  Nose: No mucosal edema or rhinorrhea. Right sinus exhibits no maxillary sinus tenderness and no frontal sinus tenderness. Left sinus exhibits no maxillary sinus tenderness and no frontal sinus tenderness.  Mouth/Throat: Uvula is midline, oropharynx is clear and moist and mucous membranes are normal.  Eyes: Pupils are equal, round, and reactive to light. Conjunctivae, EOM and lids are normal. Lids are everted and swept, no foreign bodies found.  Neck: Trachea normal and normal range of motion. Neck supple. Carotid bruit is not present. No thyroid mass and no thyromegaly present.  Cardiovascular: Normal rate, regular rhythm, S1 normal, S2 normal, normal heart sounds, intact distal pulses and normal pulses. Exam reveals no gallop and no friction rub.  No murmur heard. Pulmonary/Chest: Effort normal and breath sounds normal. No tachypnea. No respiratory distress. She has no decreased breath sounds. She has no wheezes. She has no rhonchi. She has no rales.  Abdominal: Soft. Normal appearance and bowel sounds are normal. There is no tenderness.  Musculoskeletal:       Cervical back: She exhibits decreased  range of motion, tenderness and bony tenderness.       Thoracic back: Normal.       Lumbar back: Normal.  Neg spurling's   ttp in right DIP joint of thumb, no redness, no swelling, no contusion, very ttp at  nail Neg tinel and phalen  Neurological: She is alert. She displays no atrophy. No cranial nerve deficit or sensory deficit. She exhibits normal muscle tone. She displays a negative Romberg sign. Gait normal.  Skin: Skin is warm, dry and intact. No rash noted.  Psychiatric: Her speech is normal and behavior is normal. Judgment and thought content normal. Her mood appears not anxious. Cognition and memory are normal. She does not exhibit a depressed mood.   Body mass index is 41.87 kg/m.    Diabetic foot exam: Normal inspection No skin breakdown No calluses  Normal DP pulses Normal sensation to light touch and monofilament Nails normal     Assessment & Plan:

## 2018-04-15 NOTE — Patient Instructions (Addendum)
Get back to water aerobics as able.  keep up with low carb and low chol diet.  Increase metfromin to 2 a day.   Call if interested in glucometer to check blood sugars.

## 2018-04-20 ENCOUNTER — Other Ambulatory Visit: Payer: Self-pay | Admitting: Orthopedic Surgery

## 2018-04-20 DIAGNOSIS — D1800 Hemangioma unspecified site: Secondary | ICD-10-CM

## 2018-04-27 ENCOUNTER — Other Ambulatory Visit: Payer: Self-pay | Admitting: Family Medicine

## 2018-05-03 ENCOUNTER — Other Ambulatory Visit: Payer: Self-pay | Admitting: Family Medicine

## 2018-05-03 ENCOUNTER — Encounter: Payer: Self-pay | Admitting: Family Medicine

## 2018-05-03 NOTE — Telephone Encounter (Signed)
Last office visit 04/15/2018 for DM.  Last refilled 02/04/2018 for #180 with no refills. Next Appt: 07/19/2018 for 3 month follow up.

## 2018-05-08 ENCOUNTER — Other Ambulatory Visit: Payer: Self-pay | Admitting: Family Medicine

## 2018-05-09 NOTE — Telephone Encounter (Signed)
Electronic refill request Flexeril Last office visit 04/15/18  Last refill 03/04/18 #30/1

## 2018-05-13 ENCOUNTER — Other Ambulatory Visit: Payer: Managed Care, Other (non HMO)

## 2018-05-18 ENCOUNTER — Other Ambulatory Visit: Payer: Self-pay | Admitting: *Deleted

## 2018-05-18 MED ORDER — ATORVASTATIN CALCIUM 20 MG PO TABS: 20.0000 mg | ORAL_TABLET | Freq: Every day | ORAL | 3 refills | Status: DC

## 2018-05-22 ENCOUNTER — Ambulatory Visit
Admission: RE | Admit: 2018-05-22 | Discharge: 2018-05-22 | Disposition: A | Discharge status: Home / Self Care | Payer: Managed Care, Other (non HMO) | Source: Ambulatory Visit | Attending: Orthopedic Surgery | Admitting: Orthopedic Surgery

## 2018-05-22 ENCOUNTER — Other Ambulatory Visit: Payer: Managed Care, Other (non HMO)

## 2018-05-22 DIAGNOSIS — D1800 Hemangioma unspecified site: Secondary | ICD-10-CM

## 2018-06-06 ENCOUNTER — Other Ambulatory Visit (HOSPITAL_COMMUNITY): Payer: Self-pay | Admitting: Orthopedic Surgery

## 2018-06-06 DIAGNOSIS — R2230 Localized swelling, mass and lump, unspecified upper limb: Secondary | ICD-10-CM

## 2018-06-16 ENCOUNTER — Other Ambulatory Visit: Payer: Self-pay | Admitting: Family Medicine

## 2018-06-17 ENCOUNTER — Other Ambulatory Visit (HOSPITAL_COMMUNITY): Payer: Self-pay | Admitting: Orthopedic Surgery

## 2018-06-17 ENCOUNTER — Ambulatory Visit (HOSPITAL_COMMUNITY)
Admission: RE | Admit: 2018-06-17 | Discharge: 2018-06-17 | Disposition: A | Discharge status: Home / Self Care | Payer: Managed Care, Other (non HMO) | Source: Ambulatory Visit | Attending: Orthopedic Surgery | Admitting: Orthopedic Surgery

## 2018-06-17 ENCOUNTER — Encounter (HOSPITAL_COMMUNITY)
Admission: RE | Admit: 2018-06-17 | Discharge: 2018-06-17 | Disposition: A | Discharge status: Home / Self Care | Payer: Managed Care, Other (non HMO) | Source: Ambulatory Visit | Attending: Orthopedic Surgery | Admitting: Orthopedic Surgery

## 2018-06-17 DIAGNOSIS — R2230 Localized swelling, mass and lump, unspecified upper limb: Secondary | ICD-10-CM | POA: Diagnosis present

## 2018-06-17 DIAGNOSIS — R2231 Localized swelling, mass and lump, right upper limb: Secondary | ICD-10-CM | POA: Diagnosis not present

## 2018-06-17 MED ORDER — TECHNETIUM TC 99M MEDRONATE IV KIT
20.0000 | PACK | Freq: Once | INTRAVENOUS | Status: AC | PRN
  Administered 2018-06-17: 20 via INTRAVENOUS

## 2018-06-28 ENCOUNTER — Other Ambulatory Visit: Payer: Self-pay | Admitting: Orthopedic Surgery

## 2018-07-12 ENCOUNTER — Other Ambulatory Visit: Payer: Managed Care, Other (non HMO)

## 2018-07-12 ENCOUNTER — Telehealth: Payer: Self-pay | Admitting: Family Medicine

## 2018-07-12 DIAGNOSIS — E119 Type 2 diabetes mellitus without complications: Secondary | ICD-10-CM

## 2018-07-12 NOTE — Telephone Encounter (Signed)
-----   Message from Lendon Collar, RT sent at 07/04/2018  9:19 AM EST ----- Regarding: Lab orders for Tuesday 07/12/18 Please enter lab orders for upcoming DM followup. Thanks!

## 2018-07-14 ENCOUNTER — Other Ambulatory Visit: Payer: Self-pay | Admitting: Family Medicine

## 2018-07-14 NOTE — Telephone Encounter (Signed)
Last office visit 04/15/2018 for DM.  Last refilled 05/09/2018 for #30 with 1 refill.  Next Appt: 07/19/2018 for 3 month follow up on diabetes.

## 2018-07-19 ENCOUNTER — Ambulatory Visit: Payer: Managed Care, Other (non HMO) | Admitting: Family Medicine

## 2018-07-31 ENCOUNTER — Other Ambulatory Visit: Payer: Self-pay | Admitting: Family Medicine

## 2018-08-01 NOTE — Telephone Encounter (Signed)
Last office visit 04/15/2018 for DM.  Last refilled 05/03/2018 for #180 with no refills.  Next Appt: 08/19/2018 for DM.  Ok to refill?

## 2018-08-03 ENCOUNTER — Encounter (HOSPITAL_BASED_OUTPATIENT_CLINIC_OR_DEPARTMENT_OTHER): Payer: Self-pay | Admitting: *Deleted

## 2018-08-03 ENCOUNTER — Other Ambulatory Visit: Payer: Self-pay

## 2018-08-05 ENCOUNTER — Encounter (HOSPITAL_BASED_OUTPATIENT_CLINIC_OR_DEPARTMENT_OTHER)
Admission: RE | Admit: 2018-08-05 | Discharge: 2018-08-05 | Disposition: A | Discharge status: Home / Self Care | Payer: Managed Care, Other (non HMO) | Source: Ambulatory Visit | Attending: Orthopedic Surgery | Admitting: Orthopedic Surgery

## 2018-08-05 DIAGNOSIS — Z79899 Other long term (current) drug therapy: Secondary | ICD-10-CM | POA: Diagnosis not present

## 2018-08-05 DIAGNOSIS — R2231 Localized swelling, mass and lump, right upper limb: Secondary | ICD-10-CM | POA: Diagnosis present

## 2018-08-05 DIAGNOSIS — D1801 Hemangioma of skin and subcutaneous tissue: Secondary | ICD-10-CM | POA: Diagnosis not present

## 2018-08-05 DIAGNOSIS — E282 Polycystic ovarian syndrome: Secondary | ICD-10-CM | POA: Diagnosis not present

## 2018-08-05 DIAGNOSIS — Z7984 Long term (current) use of oral hypoglycemic drugs: Secondary | ICD-10-CM | POA: Diagnosis not present

## 2018-08-05 DIAGNOSIS — I10 Essential (primary) hypertension: Secondary | ICD-10-CM | POA: Diagnosis not present

## 2018-08-05 DIAGNOSIS — Z6841 Body Mass Index (BMI) 40.0 and over, adult: Secondary | ICD-10-CM | POA: Diagnosis not present

## 2018-08-05 DIAGNOSIS — E119 Type 2 diabetes mellitus without complications: Secondary | ICD-10-CM | POA: Diagnosis not present

## 2018-08-05 DIAGNOSIS — Z01818 Encounter for other preprocedural examination: Secondary | ICD-10-CM | POA: Diagnosis present

## 2018-08-05 LAB — BASIC METABOLIC PANEL
Anion gap: 10 (ref 5–15)
BUN: 19 mg/dL (ref 6–20)
CO2: 23 mmol/L (ref 22–32)
Calcium: 9.9 mg/dL (ref 8.9–10.3)
Chloride: 104 mmol/L (ref 98–111)
Creatinine, Ser: 0.9 mg/dL (ref 0.44–1.00)
GFR calc Af Amer: >60 mL/min (ref >60)
Glucose, Bld: 156 mg/dL — ABNORMAL HIGH (ref 70–99)
Potassium: 4.5 mmol/L (ref 3.5–5.1)
Sodium: 137 mmol/L (ref 135–145)

## 2018-08-08 NOTE — Anesthesia Preprocedure Evaluation (Addendum)
Anesthesia Evaluation  Patient identified by MRN, date of birth, ID band Patient awake    Reviewed: Allergy & Precautions, NPO status , Patient's Chart, lab work & pertinent test results  History of Anesthesia Complications Negative for: history of anesthetic complications  Airway Mallampati: III  TM Distance: >3 FB Neck ROM: Full    Dental  (+) Teeth Intact, Dental Advisory Given   Pulmonary neg pulmonary ROS,    Pulmonary exam normal breath sounds clear to auscultation       Cardiovascular hypertension, Pt. on medications Normal cardiovascular exam Rhythm:Regular Rate:Normal     Neuro/Psych negative neurological ROS     GI/Hepatic negative GI ROS, Neg liver ROS,   Endo/Other  diabetes, Type 2, Oral Hypoglycemic AgentsMorbid obesity  Renal/GU negative Renal ROS     Musculoskeletal negative musculoskeletal ROS (+)   Abdominal   Peds  Hematology negative hematology ROS (+)   Anesthesia Other Findings Day of surgery medications reviewed with the patient.  Reproductive/Obstetrics                            Anesthesia Physical Anesthesia Plan  ASA: III  Anesthesia Plan: Bier Block and Bier Block-LIDOCAINE ONLY   Post-op Pain Management:    Induction:   PONV Risk Score and Plan: 2 and Treatment may vary due to age or medical condition and Propofol infusion  Airway Management Planned: Natural Airway and Simple Face Mask  Additional Equipment:   Intra-op Plan:   Post-operative Plan:   Informed Consent: I have reviewed the patients History and Physical, chart, labs and discussed the procedure including the risks, benefits and alternatives for the proposed anesthesia with the patient or authorized representative who has indicated his/her understanding and acceptance.     Dental advisory given  Plan Discussed with:   Anesthesia Plan Comments:        Anesthesia Quick  Evaluation

## 2018-08-09 ENCOUNTER — Encounter (HOSPITAL_BASED_OUTPATIENT_CLINIC_OR_DEPARTMENT_OTHER)
Admission: RE | Disposition: A | Discharge status: Home / Self Care | Payer: Self-pay | Source: Home / Self Care | Attending: Orthopedic Surgery

## 2018-08-09 ENCOUNTER — Encounter (HOSPITAL_BASED_OUTPATIENT_CLINIC_OR_DEPARTMENT_OTHER): Payer: Self-pay | Admitting: Anesthesiology

## 2018-08-09 ENCOUNTER — Ambulatory Visit (HOSPITAL_BASED_OUTPATIENT_CLINIC_OR_DEPARTMENT_OTHER)
Admission: RE | Admit: 2018-08-09 | Discharge: 2018-08-09 | Disposition: A | Discharge status: Home / Self Care | Payer: Managed Care, Other (non HMO) | Attending: Orthopedic Surgery | Admitting: Orthopedic Surgery

## 2018-08-09 ENCOUNTER — Other Ambulatory Visit: Payer: Self-pay

## 2018-08-09 ENCOUNTER — Ambulatory Visit (HOSPITAL_BASED_OUTPATIENT_CLINIC_OR_DEPARTMENT_OTHER): Payer: Managed Care, Other (non HMO) | Admitting: Anesthesiology

## 2018-08-09 DIAGNOSIS — D1801 Hemangioma of skin and subcutaneous tissue: Secondary | ICD-10-CM | POA: Insufficient documentation

## 2018-08-09 DIAGNOSIS — E119 Type 2 diabetes mellitus without complications: Secondary | ICD-10-CM | POA: Insufficient documentation

## 2018-08-09 DIAGNOSIS — I10 Essential (primary) hypertension: Secondary | ICD-10-CM | POA: Insufficient documentation

## 2018-08-09 DIAGNOSIS — Z7984 Long term (current) use of oral hypoglycemic drugs: Secondary | ICD-10-CM | POA: Insufficient documentation

## 2018-08-09 DIAGNOSIS — E282 Polycystic ovarian syndrome: Secondary | ICD-10-CM | POA: Insufficient documentation

## 2018-08-09 DIAGNOSIS — Z6841 Body Mass Index (BMI) 40.0 and over, adult: Secondary | ICD-10-CM | POA: Insufficient documentation

## 2018-08-09 DIAGNOSIS — Z79899 Other long term (current) drug therapy: Secondary | ICD-10-CM | POA: Insufficient documentation

## 2018-08-09 HISTORY — PX: MASS EXCISION: SHX2000

## 2018-08-09 HISTORY — DX: Type 2 diabetes mellitus without complications: E11.9

## 2018-08-09 LAB — GLUCOSE, CAPILLARY: Glucose-Capillary: 141 mg/dL — ABNORMAL HIGH (ref 70–99)

## 2018-08-09 SURGERY — EXCISION MASS
Anesthesia: Regional | Site: Hand | Laterality: Right

## 2018-08-09 MED ORDER — FENTANYL CITRATE (PF) 100 MCG/2ML IJ SOLN: 50.0000 ug | INTRAMUSCULAR | Status: DC | PRN

## 2018-08-09 MED ORDER — OXYCODONE HCL 5 MG/5ML PO SOLN: 5.0000 mg | Freq: Once | ORAL | Status: DC | PRN

## 2018-08-09 MED ORDER — MIDAZOLAM HCL 2 MG/2ML IJ SOLN: 1.0000 mg | INTRAMUSCULAR | Status: DC | PRN

## 2018-08-09 MED ORDER — CEFAZOLIN SODIUM-DEXTROSE 2-4 GM/100ML-% IV SOLN
INTRAVENOUS | Status: AC
  Filled 2018-08-09: qty 100

## 2018-08-09 MED ORDER — ONDANSETRON HCL 4 MG/2ML IJ SOLN
INTRAMUSCULAR | Status: DC | PRN
  Administered 2018-08-09: 4 mg via INTRAVENOUS

## 2018-08-09 MED ORDER — PROPOFOL 500 MG/50ML IV EMUL
INTRAVENOUS | Status: DC | PRN
  Administered 2018-08-09: 25 ug/kg/min via INTRAVENOUS

## 2018-08-09 MED ORDER — ACETAMINOPHEN 10 MG/ML IV SOLN: 1000.0000 mg | Freq: Once | INTRAVENOUS | Status: DC | PRN

## 2018-08-09 MED ORDER — PROMETHAZINE HCL 25 MG/ML IJ SOLN: 6.2500 mg | INTRAMUSCULAR | Status: DC | PRN

## 2018-08-09 MED ORDER — CHLORHEXIDINE GLUCONATE 4 % EX LIQD: 60.0000 mL | Freq: Once | CUTANEOUS | Status: DC

## 2018-08-09 MED ORDER — MIDAZOLAM HCL 5 MG/5ML IJ SOLN
INTRAMUSCULAR | Status: DC | PRN
  Administered 2018-08-09 (×2): 1 mg via INTRAVENOUS

## 2018-08-09 MED ORDER — FENTANYL CITRATE (PF) 100 MCG/2ML IJ SOLN
INTRAMUSCULAR | Status: AC
  Filled 2018-08-09: qty 2

## 2018-08-09 MED ORDER — ONDANSETRON HCL 4 MG/2ML IJ SOLN
INTRAMUSCULAR | Status: AC
  Filled 2018-08-09: qty 2

## 2018-08-09 MED ORDER — FENTANYL CITRATE (PF) 100 MCG/2ML IJ SOLN
INTRAMUSCULAR | Status: DC | PRN
  Administered 2018-08-09 (×2): 50 ug via INTRAVENOUS

## 2018-08-09 MED ORDER — MIDAZOLAM HCL 2 MG/2ML IJ SOLN
INTRAMUSCULAR | Status: AC
  Filled 2018-08-09: qty 2

## 2018-08-09 MED ORDER — LIDOCAINE 2% (20 MG/ML) 5 ML SYRINGE
INTRAMUSCULAR | Status: AC
  Filled 2018-08-09: qty 5

## 2018-08-09 MED ORDER — OXYCODONE HCL 5 MG PO TABS: 5.0000 mg | ORAL_TABLET | Freq: Once | ORAL | Status: DC | PRN

## 2018-08-09 MED ORDER — LACTATED RINGERS IV SOLN
INTRAVENOUS | Status: DC
  Administered 2018-08-09: 10 mL/h via INTRAVENOUS

## 2018-08-09 MED ORDER — TRAMADOL HCL 50 MG PO TABS: 50.0000 mg | ORAL_TABLET | Freq: Four times a day (QID) | ORAL | 0 refills | Status: DC | PRN

## 2018-08-09 MED ORDER — CEFAZOLIN SODIUM-DEXTROSE 2-4 GM/100ML-% IV SOLN
2.0000 g | INTRAVENOUS | Status: AC
  Administered 2018-08-09: 2 g via INTRAVENOUS

## 2018-08-09 MED ORDER — BUPIVACAINE HCL (PF) 0.25 % IJ SOLN
INTRAMUSCULAR | Status: DC | PRN
  Administered 2018-08-09: 10 mL

## 2018-08-09 MED ORDER — DEXAMETHASONE SODIUM PHOSPHATE 10 MG/ML IJ SOLN
INTRAMUSCULAR | Status: AC
  Filled 2018-08-09: qty 1

## 2018-08-09 MED ORDER — SCOPOLAMINE 1 MG/3DAYS TD PT72: 1.0000 | MEDICATED_PATCH | Freq: Once | TRANSDERMAL | Status: DC | PRN

## 2018-08-09 MED ORDER — FENTANYL CITRATE (PF) 100 MCG/2ML IJ SOLN: 25.0000 ug | INTRAMUSCULAR | Status: DC | PRN

## 2018-08-09 SURGICAL SUPPLY — 47 items
BLADE SURG 15 STRL LF DISP TIS (BLADE) ×1 IMPLANT
BLADE SURG 15 STRL SS (BLADE) ×3
BNDG CMPR 9X4 STRL LF SNTH (GAUZE/BANDAGES/DRESSINGS) ×1
BNDG COHESIVE 1X5 TAN STRL LF (GAUZE/BANDAGES/DRESSINGS) ×2 IMPLANT
BNDG COHESIVE 2X5 TAN STRL LF (GAUZE/BANDAGES/DRESSINGS) IMPLANT
BNDG COHESIVE 3X5 TAN STRL LF (GAUZE/BANDAGES/DRESSINGS) IMPLANT
BNDG ESMARK 4X9 LF (GAUZE/BANDAGES/DRESSINGS) ×2 IMPLANT
BNDG GAUZE ELAST 4 BULKY (GAUZE/BANDAGES/DRESSINGS) IMPLANT
CHLORAPREP W/TINT 26ML (MISCELLANEOUS) ×3 IMPLANT
CORD BIPOLAR FORCEPS 12FT (ELECTRODE) ×3 IMPLANT
COVER BACK TABLE 60X90IN (DRAPES) ×3 IMPLANT
COVER MAYO STAND STRL (DRAPES) ×3 IMPLANT
COVER WAND RF STERILE (DRAPES) IMPLANT
CUFF TOURNIQUET SINGLE 18IN (TOURNIQUET CUFF) ×2 IMPLANT
DECANTER SPIKE VIAL GLASS SM (MISCELLANEOUS) IMPLANT
DRAIN PENROSE 1/2X12 LTX STRL (WOUND CARE) IMPLANT
DRAPE EXTREMITY T 121X128X90 (DISPOSABLE) ×3 IMPLANT
DRAPE SURG 17X23 STRL (DRAPES) ×3 IMPLANT
GAUZE SPONGE 4X4 12PLY STRL (GAUZE/BANDAGES/DRESSINGS) ×3 IMPLANT
GAUZE XEROFORM 1X8 LF (GAUZE/BANDAGES/DRESSINGS) ×3 IMPLANT
GLOVE BIOGEL PI IND STRL 7.0 (GLOVE) IMPLANT
GLOVE BIOGEL PI IND STRL 8 (GLOVE) IMPLANT
GLOVE BIOGEL PI IND STRL 8.5 (GLOVE) ×1 IMPLANT
GLOVE BIOGEL PI INDICATOR 7.0 (GLOVE) ×2
GLOVE BIOGEL PI INDICATOR 8 (GLOVE) ×2
GLOVE BIOGEL PI INDICATOR 8.5 (GLOVE) ×2
GLOVE SURG ORTHO 8.0 STRL STRW (GLOVE) ×3 IMPLANT
GOWN STRL REUS W/ TWL LRG LVL3 (GOWN DISPOSABLE) ×1 IMPLANT
GOWN STRL REUS W/TWL LRG LVL3 (GOWN DISPOSABLE) ×3
GOWN STRL REUS W/TWL XL LVL3 (GOWN DISPOSABLE) ×3 IMPLANT
NDL PRECISIONGLIDE 27X1.5 (NEEDLE) ×1 IMPLANT
NDL SAFETY ECLIPSE 18X1.5 (NEEDLE) IMPLANT
NEEDLE HYPO 18GX1.5 SHARP (NEEDLE)
NEEDLE PRECISIONGLIDE 27X1.5 (NEEDLE) ×3 IMPLANT
NS IRRIG 1000ML POUR BTL (IV SOLUTION) ×3 IMPLANT
PACK BASIN DAY SURGERY FS (CUSTOM PROCEDURE TRAY) ×3 IMPLANT
PAD CAST 3X4 CTTN HI CHSV (CAST SUPPLIES) IMPLANT
PADDING CAST COTTON 3X4 STRL (CAST SUPPLIES)
SPLINT PLASTER CAST XFAST 3X15 (CAST SUPPLIES) IMPLANT
SPLINT PLASTER XTRA FASTSET 3X (CAST SUPPLIES)
STOCKINETTE 4X48 STRL (DRAPES) ×3 IMPLANT
SUT ETHILON 4 0 PS 2 18 (SUTURE) ×3 IMPLANT
SUT VIC AB 4-0 P2 18 (SUTURE) IMPLANT
SYR BULB 3OZ (MISCELLANEOUS) ×3 IMPLANT
SYR CONTROL 10ML LL (SYRINGE) ×3 IMPLANT
TOWEL GREEN STERILE FF (TOWEL DISPOSABLE) ×3 IMPLANT
UNDERPAD 30X30 (UNDERPADS AND DIAPERS) ×3 IMPLANT

## 2018-08-09 NOTE — Addendum Note (Signed)
Addendum  created 08/09/18 1256 by Marrianne Mood, CRNA   Intraprocedure Event deleted, Intraprocedure Event edited

## 2018-08-09 NOTE — Addendum Note (Signed)
Addendum  created 08/09/18 1254 by Marrianne Mood, CRNA   Intraprocedure Event edited

## 2018-08-09 NOTE — Addendum Note (Signed)
Addendum  created 08/09/18 1353 by Marrianne Mood, CRNA   Intraprocedure Event edited

## 2018-08-09 NOTE — Discharge Instructions (Addendum)

## 2018-08-09 NOTE — Op Note (Signed)
NAME: Kathy Mccarthy MEDICAL RECORD NO: [id number] DATE OF BIRTH: [birth year] FACILITY: [facility] LOCATION: [facility] PHYSICIAN: Wynonia Sours, MD   OPERATIVE REPORT   DATE OF PROCEDURE: 08/09/18    PREOPERATIVE DIAGNOSIS:   Mass nailbed right thumb   POSTOPERATIVE DIAGNOSIS:   Same   PROCEDURE:   Excisional biopsy mass nailbed right thumb   SURGEON: Daryll Brod, M.D.   ASSISTANT: none   ANESTHESIA:  Bier block with sedation and Local   INTRAVENOUS FLUIDS:  Per anesthesia flow sheet.   ESTIMATED BLOOD LOSS:  Minimal.   COMPLICATIONS:  None.   SPECIMENS:   Mass   TOURNIQUET TIME:    Total Tourniquet Time Documented: Forearm (Right) - 21 minutes Total: Forearm (Right) - 21 minutes    DISPOSITION:  Stable to PACU.   INDICATIONS: Patient is a 48 year old female with a history of pain in her right thumb with a area of discoloration in the nailbed.  Her son reveals a mass she was unable to tolerate an MRI.  She is admitted to have this removed.  Pre-peri-and postoperative course been discussed along with risks and complications.  She is aware that there is no guarantee to the surgery the possibility of infection recurrence injury to arteries nerves tendons complete relief symptoms distally in the preoperative area the patient seen extremity marked by both patient and surgeon antibiotic given  OPERATIVE COURSE: Patient is brought to the operating room where form based IV regional anesthetic was carried out without difficulty under the direction of the anesthesia department.  She was prepped using ChloraPrep in a supine position with the right arm free.  A three-minute dry time was allowed timeout taken to confirm patient procedure.  A metacarpal block was given quarter percent bupivacaine without epinephrine and 10 cc was used.  The nail plate was then removed from the nail of her right thumb.  The area of discoloration was immediately apparent.  A Beaver blade was  then used to make an incision over the mass which immediately appeared.  This was multilobulated pink tissue.  It was easily separated from the surrounding nail matrix.  Vessel entering into it was coagulated with bipolar.  The specimen was sent to pathology.  Was removed in toto.  It measured 3 x 5 mm in size.  Was copiously irrigated with saline.  The nail bed was then repaired with interrupted 6-0 chromic sutures.  The nail plate was then reapproximated in the nail fold.  A sterile compressive dressing and splint to the thumb applied.  On deflation of the tourniquet remaining fingers pink.  She was taken to the recovery room in satisfactory condition.  She will be discharged home to return the [facility] in 1week Tylenol ibuprofen with pain for her and Ultram as a backup.   Daryll Brod, MD Electronically signed, 08/09/18

## 2018-08-09 NOTE — H&P (Signed)
  Kathy Mccarthy is an 48 y.o. female.   Chief Complaint:pain right thumbHPI:Kathy Mccarthy is a 48 year old right-hand-dominant female referred by Dr. Diona Browner for consultation regarding pain at the nail corner ulnar-sided right thumb. This been going on for approximately 6-7 months. She states it is a sharp stabbing type pain with pressure over feeling of cutting with a VAS score 10/10 especially if there are nail plate is hit. She states squeezing the finger from the side relieves the discomfort for her. She states that she has taken Tylenol which he thinks helps. She has not noticed any change with cold. It does not awaken her at night. She is not complaining of numbness or tingling. She has no history of injury. She is borderline diabetic. She has no history of thyroid problems arthritis and gout. Family history is unknown and that she is adopted. She was not able to have an MRI done. She was sent for ultrasound which reveals a mass hypoechoic  on the ulnar aspect of the digit right thumb . This continues to be painful for her.      Past Medical History:  Diagnosis Date  . Dermatophytosis of nail   . Elevated blood pressure reading without diagnosis of hypertension   . Ganglion of tendon sheath   . Pain in joint, multiple sites   . PCOS (polycystic ovarian syndrome)   . Pure hypercholesterolemia   . Unspecified disorder of skin and subcutaneous tissue     Past Surgical History:  Procedure Laterality Date  . NO PAST SURGERIES      Family History  Adopted: Yes  Problem Relation Age of Onset  . Stroke Mother        mother's side  . Deep vein thrombosis Mother        mother's side   Social History:  reports that she has never smoked. She has never used smokeless tobacco. She reports that she does not drink alcohol or use drugs.  Allergies: No Known Allergies  No medications prior to admission.    No results found for this or any previous visit (from the past 48 hour(s)).  No results  found.   Pertinent items are noted in HPI.  Height 5\' 9"  (1.753 m), weight 124.7 kg.  General appearance: alert, cooperative and appears stated age Head: Normocephalic, without obvious abnormality Neck: no JVD Resp: clear to auscultation bilaterally Cardio: regular rate and rhythm, S1, S2 normal, no murmur, click, rub or gallop GI: soft, non-tender; bowel sounds normal; no masses,  no organomegaly Extremities: mass right thumb nail bed Pulses: 2+ and symmetric Skin: Skin color, texture, turgor normal. No rashes or lesions Neurologic: Grossly normal Incision/Wound: na   Assessment/Plan  Assessment:  1. Mass    Plan: We have discussed biopsy with this. 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 Pre-peri-and postoperative course are discussed along with risks and complications. She is aware there is no guarantee to the surgery possibility of infection recurrence injury to arteries nerves tendons complete relief symptoms dystrophy. Deformity of the nail was discussed. She is scheduled for biopsy right thumb nail matrix in outpatient under regional anesthesia.   Daryll Brod 08/09/2018, 4:21 AM

## 2018-08-09 NOTE — Anesthesia Postprocedure Evaluation (Signed)
Anesthesia Post Note  Patient: Kathy Mccarthy  Procedure(s) Performed: EXCISION MASS RIGHT THUMB NAILBED (Right Hand)     Patient location during evaluation: PACU Anesthesia Type: Bier Block Level of consciousness: awake and alert Pain management: pain level controlled Vital Signs Assessment: post-procedure vital signs reviewed and stable Respiratory status: spontaneous breathing, nonlabored ventilation and respiratory function stable Cardiovascular status: blood pressure returned to baseline and stable Postop Assessment: no apparent nausea or vomiting Anesthetic complications: no    Last Vitals:  Vitals:   08/09/18 0911 08/09/18 0954  BP:    Pulse: 88 80  Resp: 14 18  Temp:  36.6 C  SpO2: 99% 97%    Last Pain:  Vitals:   08/09/18 0954  TempSrc:   PainSc: 0-No pain                 Brennan Bailey

## 2018-08-09 NOTE — Transfer of Care (Signed)
Immediate Anesthesia Transfer of Care Note  Patient: Kathy Mccarthy  Procedure(s) Performed: EXCISION MASS RIGHT THUMB NAILBED (Right Hand)  Patient Location: PACU  Anesthesia Type:MAC and Bier block  Level of Consciousness: awake and patient cooperative  Airway & Oxygen Therapy: Patient Spontanous Breathing and Patient connected to face mask oxygen  Post-op Assessment: Report given to RN and Post -op Vital signs reviewed and stable  Post vital signs: Reviewed and stable  Last Vitals:  Vitals Value Taken Time  BP 133/84 08/09/2018  9:12 AM  Temp 37.1 C 08/09/2018  9:10 AM  Pulse 88 08/09/2018  9:15 AM  Resp 15 08/09/2018  9:15 AM  SpO2 94 % 08/09/2018  9:15 AM  Vitals shown include unvalidated device data.  Last Pain:  Vitals:   08/09/18 0910  TempSrc:   PainSc: 0-No pain         Complications: No apparent anesthesia complications

## 2018-08-09 NOTE — Anesthesia Procedure Notes (Addendum)
Procedure Name: MAC Date/Time: 08/09/2018 8:40 AM Performed by: Marrianne Mood, CRNA Pre-anesthesia Checklist: Patient identified, Emergency Drugs available, Suction available, Patient being monitored and Timeout performed Patient Re-evaluated:Patient Re-evaluated prior to induction Oxygen Delivery Method: Simple face mask

## 2018-08-09 NOTE — Anesthesia Procedure Notes (Addendum)
Anesthesia Regional Block: Bier block (IV Regional)   Pre-Anesthetic Checklist: ,, timeout performed, Correct Patient, Correct Site, Correct Laterality, Correct Procedure, Correct Position, site marked, Risks and benefits discussed,  Surgical consent,  Pre-op evaluation,  At surgeon's request and post-op pain management  Laterality: Right      Narrative:  Start time: 08/09/2018 8:46 AM End time: 08/09/2018 8:48 AM  Events: blood aspirated,,,,,,,,,,

## 2018-08-09 NOTE — Brief Op Note (Signed)
08/09/2018  9:11 AM  PATIENT:  Kathy Mccarthy  48 y.o. female  PRE-OPERATIVE DIAGNOSIS:  MASS RIGHT THUMB NAILBED  POST-OPERATIVE DIAGNOSIS:  MASS RIGHT THUMB NAILBED  PROCEDURE:  Procedure(s) with comments: EXCISION MASS RIGHT THUMB NAILBED (Right) - FAB  SURGEON:  Surgeon(s) and Role:    * Daryll Brod, MD - Primary  PHYSICIAN ASSISTANT:   ASSISTANTS: none   ANESTHESIA:   local, regional and IV sedation  EBL:  2 mL   BLOOD ADMINISTERED:none  DRAINS: none   LOCAL MEDICATIONS USED:  BUPIVICAINE   SPECIMEN:  Excision  DISPOSITION OF SPECIMEN:  PATHOLOGY  COUNTS:  YES  TOURNIQUET:   Total Tourniquet Time Documented: Forearm (Right) - 21 minutes Total: Forearm (Right) - 21 minutes   DICTATION: .Dragon Dictation  PLAN OF CARE: Discharge to home after PACU  PATIENT DISPOSITION:  PACU - hemodynamically stable.

## 2018-08-10 ENCOUNTER — Encounter (HOSPITAL_BASED_OUTPATIENT_CLINIC_OR_DEPARTMENT_OTHER): Payer: Self-pay | Admitting: Orthopedic Surgery

## 2018-08-11 ENCOUNTER — Other Ambulatory Visit: Payer: Self-pay | Admitting: Family Medicine

## 2018-08-15 ENCOUNTER — Other Ambulatory Visit (INDEPENDENT_AMBULATORY_CARE_PROVIDER_SITE_OTHER): Payer: Managed Care, Other (non HMO)

## 2018-08-15 DIAGNOSIS — E119 Type 2 diabetes mellitus without complications: Secondary | ICD-10-CM

## 2018-08-15 LAB — HEMOGLOBIN A1C: Hgb A1c MFr Bld: 7 % — ABNORMAL HIGH (ref 4.6–6.5)

## 2018-08-15 LAB — COMPREHENSIVE METABOLIC PANEL
ALT: 24 U/L (ref 0–35)
AST: 22 U/L (ref 0–37)
Albumin: 4.6 g/dL (ref 3.5–5.2)
Alkaline Phosphatase: 30 U/L — ABNORMAL LOW (ref 39–117)
BUN: 18 mg/dL (ref 6–23)
CO2: 28 mEq/L (ref 19–32)
Calcium: 9.9 mg/dL (ref 8.4–10.5)
Chloride: 101 mEq/L (ref 96–112)
Creatinine, Ser: 0.89 mg/dL (ref 0.40–1.20)
GFR: 67.32 mL/min (ref >60.00)
Glucose, Bld: 107 mg/dL — ABNORMAL HIGH (ref 70–99)
POTASSIUM: 4.4 meq/L (ref 3.5–5.1)
Sodium: 138 mEq/L (ref 135–145)
TOTAL PROTEIN: 7.3 g/dL (ref 6.0–8.3)
Total Bilirubin: 0.7 mg/dL (ref 0.2–1.2)

## 2018-08-15 LAB — LIPID PANEL
Cholesterol: 250 mg/dL — ABNORMAL HIGH (ref 0–200)
HDL: 35.4 mg/dL — ABNORMAL LOW (ref >39.00)
NonHDL: 214.78
Total CHOL/HDL Ratio: 7
Triglycerides: 386 mg/dL — ABNORMAL HIGH (ref 0.0–149.0)
VLDL: 77.2 mg/dL — ABNORMAL HIGH (ref 0.0–40.0)

## 2018-08-15 LAB — LDL CHOLESTEROL, DIRECT: Direct LDL: 160 mg/dL

## 2018-08-19 ENCOUNTER — Ambulatory Visit: Payer: Managed Care, Other (non HMO) | Admitting: Family Medicine

## 2018-08-19 ENCOUNTER — Encounter: Payer: Self-pay | Admitting: Family Medicine

## 2018-08-19 VITALS — BP 120/90 | HR 88 | Temp 97.9°F | Ht 69.0 in | Wt 277.5 lb

## 2018-08-19 DIAGNOSIS — I10 Essential (primary) hypertension: Secondary | ICD-10-CM

## 2018-08-19 DIAGNOSIS — E119 Type 2 diabetes mellitus without complications: Secondary | ICD-10-CM

## 2018-08-19 DIAGNOSIS — R945 Abnormal results of liver function studies: Secondary | ICD-10-CM | POA: Diagnosis not present

## 2018-08-19 DIAGNOSIS — Z6841 Body Mass Index (BMI) 40.0 and over, adult: Secondary | ICD-10-CM

## 2018-08-19 DIAGNOSIS — E7800 Pure hypercholesterolemia, unspecified: Secondary | ICD-10-CM | POA: Diagnosis not present

## 2018-08-19 DIAGNOSIS — R7989 Other specified abnormal findings of blood chemistry: Secondary | ICD-10-CM

## 2018-08-19 LAB — HM DIABETES FOOT EXAM

## 2018-08-19 MED ORDER — ATORVASTATIN CALCIUM 20 MG PO TABS: 20.0000 mg | ORAL_TABLET | Freq: Every day | ORAL | 3 refills | Status: DC

## 2018-08-19 NOTE — Patient Instructions (Addendum)
Stop fenofibrate.  Start atorvastatin low dose.  Continue exercise, weight loss and  Low carb, low fat diet.  Get yearly eye exam for diabetes.

## 2018-08-19 NOTE — Assessment & Plan Note (Signed)
Encouraged exercise, weight loss, healthy eating habits. ? ?

## 2018-08-19 NOTE — Assessment & Plan Note (Signed)
Well controlled. Continue current medication.  

## 2018-08-19 NOTE — Assessment & Plan Note (Signed)
Stop fenofibrate now that trigs out of danger area and start statin.Marland Kitchen likely will need to increase dose.

## 2018-08-19 NOTE — Assessment & Plan Note (Signed)
Resolved

## 2018-08-19 NOTE — Progress Notes (Signed)
Subjective:    Patient ID: Kathy Mccarthy, female    DOB: 1970/11/22, 48 y.o.   MRN: [id number]  HPI    48 year old female presents for follow up DM  Diabetes:  Improved control on metformin. Lab Results  Component Value Date   HGBA1C 7.0 (H) 08/15/2018  Using medications without difficulties: Hypoglycemic episodes: Hyperglycemic episodes: Feet problems: no ulcers Blood Sugars averaging: not checkingeye exam within last year: due  Elevated Cholesterol:  Poor control on fenofibrate, LDL not at goal < 100, worse than last time. Trigs are some better this visit.  She has been eating more eggs. Lab Results  Component Value Date   CHOL 250 (H) 08/15/2018   HDL 35.40 (L) 08/15/2018   LDLDIRECT 160.0 08/15/2018   TRIG 386.0 (H) 08/15/2018   CHOLHDL 7 08/15/2018  Using medications without problems: Muscle aches:  Diet compliance:Good Exercise: water aerobic Other complaints: Morbid obesity with co morbidities.  Hypertension:   Borderline control on HCTZ, losartan  Using medication without problems or lightheadedness: none  Chest pain with exertion:none Edema: none Short of breath:none Average home BPs: at goal Other issues:   Social History /Family History/Past Medical History reviewed in detail and updated in EMR if needed.Blood pressure 120/90, pulse 88, temperature 97.9 F (36.6 C), temperature source Oral, height 5\' 9"  (1.753 m), weight 277 lb 8 oz (125.9 kg).  Review of Systems  Constitutional: Negative for fatigue and fever.  HENT: Negative for congestion.   Eyes: Negative for pain.  Respiratory: Negative for cough and shortness of breath.   Cardiovascular: Negative for chest pain, palpitations and leg swelling.  Gastrointestinal: Negative for abdominal pain.  Genitourinary: Negative for dysuria and vaginal bleeding.  Musculoskeletal: Negative for back pain.  Neurological: Negative for syncope, light-headedness and headaches.  Psychiatric/Behavioral: Negative  for dysphoric mood.       Objective:   Physical Exam Constitutional:      General: She is not in acute distress.    Appearance: Normal appearance. She is well-developed. She is obese. She is not ill-appearing or toxic-appearing.  HENT:     Head: Normocephalic.     Right Ear: Hearing, tympanic membrane, ear canal and external ear normal. Tympanic membrane is not erythematous, retracted or bulging.     Left Ear: Hearing, tympanic membrane, ear canal and external ear normal. Tympanic membrane is not erythematous, retracted or bulging.     Nose: No mucosal edema or rhinorrhea.     Right Sinus: No maxillary sinus tenderness or frontal sinus tenderness.     Left Sinus: No maxillary sinus tenderness or frontal sinus tenderness.     Mouth/Throat:     Pharynx: Uvula midline.  Eyes:     General: Lids are normal. Lids are everted, no foreign bodies appreciated.     Conjunctiva/sclera: Conjunctivae normal.     Pupils: Pupils are equal, round, and reactive to light.  Neck:     Musculoskeletal: Normal range of motion and neck supple.     Thyroid: No thyroid mass or thyromegaly.     Vascular: No carotid bruit.     Trachea: Trachea normal.  Cardiovascular:     Rate and Rhythm: Normal rate and regular rhythm.     Pulses: Normal pulses.     Heart sounds: Normal heart sounds, S1 normal and S2 normal. No murmur. No friction rub. No gallop.   Pulmonary:     Effort: Pulmonary effort is normal. No tachypnea or respiratory distress.  Breath sounds: Normal breath sounds. No decreased breath sounds, wheezing, rhonchi or rales.  Abdominal:     General: Bowel sounds are normal.     Palpations: Abdomen is soft.     Tenderness: There is no abdominal tenderness.  Skin:    General: Skin is warm and dry.     Findings: No rash.  Neurological:     Mental Status: She is alert.  Psychiatric:        Mood and Affect: Mood is not anxious or depressed.        Speech: Speech normal.        Behavior: Behavior  normal. Behavior is cooperative.        Thought Content: Thought content normal.        Judgment: Judgment normal.       Diabetic foot exam: Normal inspection No skin breakdown except crack in left heel no eryhtmea. No calluses  Normal DP pulses Normal sensation to light touch and monofilament Nails normal     Assessment & Plan:

## 2018-08-19 NOTE — Assessment & Plan Note (Signed)
Excellent improvement in control. Keep up great work.

## 2018-08-27 ENCOUNTER — Other Ambulatory Visit: Payer: Self-pay | Admitting: Family Medicine

## 2018-08-29 NOTE — Telephone Encounter (Signed)
Last office visit 08/19/2018 for DM.  Last refilled 06/16/2018 for #90 with no refills.  Next Appt with PCP 02/17/2019 for DM.

## 2018-09-09 ENCOUNTER — Other Ambulatory Visit: Payer: Self-pay | Admitting: Family Medicine

## 2018-10-16 ENCOUNTER — Other Ambulatory Visit: Payer: Self-pay | Admitting: Family Medicine

## 2018-10-26 ENCOUNTER — Other Ambulatory Visit: Payer: Self-pay | Admitting: Family Medicine

## 2018-10-26 NOTE — Telephone Encounter (Signed)
Last office visit 08/19/2018 for DM.  Last refilled 08/01/2018 for #180 with no refills.  Next Appt: 02/17/2019 for DM follow up.

## 2018-11-04 ENCOUNTER — Other Ambulatory Visit: Payer: Self-pay | Admitting: Family Medicine

## 2018-11-04 NOTE — Telephone Encounter (Signed)
Last office visit 08/19/2018 for DM.  Last refilled 09/09/2018 for #30 with 1 refill.  Next Appt: 02/17/2019 for DM.

## 2018-11-15 ENCOUNTER — Telehealth: Payer: Self-pay | Admitting: Family Medicine

## 2018-11-15 DIAGNOSIS — E119 Type 2 diabetes mellitus without complications: Secondary | ICD-10-CM

## 2018-11-15 DIAGNOSIS — E7800 Pure hypercholesterolemia, unspecified: Secondary | ICD-10-CM

## 2018-11-15 NOTE — Telephone Encounter (Signed)
-----   Message from Cloyd Stagers, RT sent at 11/09/2018  1:47 PM EDT ----- Regarding: Lab Orders for Friday 5.29.2020 Please place lab orders for Friday 5.29.2020 Thank you, Dyke Maes RT(R)

## 2018-11-16 ENCOUNTER — Telehealth: Payer: Self-pay

## 2018-11-16 NOTE — Telephone Encounter (Signed)
Left detailed VM w COVID screen and curbside info   

## 2018-11-18 ENCOUNTER — Other Ambulatory Visit (INDEPENDENT_AMBULATORY_CARE_PROVIDER_SITE_OTHER): Payer: Managed Care, Other (non HMO)

## 2018-11-18 DIAGNOSIS — E7800 Pure hypercholesterolemia, unspecified: Secondary | ICD-10-CM | POA: Diagnosis not present

## 2018-11-18 DIAGNOSIS — E119 Type 2 diabetes mellitus without complications: Secondary | ICD-10-CM

## 2018-11-18 LAB — COMPREHENSIVE METABOLIC PANEL
ALT: 26 U/L (ref 0–35)
AST: 16 U/L (ref 0–37)
Albumin: 4.3 g/dL (ref 3.5–5.2)
Alkaline Phosphatase: 44 U/L (ref 39–117)
BUN: 19 mg/dL (ref 6–23)
CO2: 27 mEq/L (ref 19–32)
Calcium: 9.6 mg/dL (ref 8.4–10.5)
Chloride: 102 mEq/L (ref 96–112)
Creatinine, Ser: 0.91 mg/dL (ref 0.40–1.20)
GFR: 65.54 mL/min (ref >60.00)
Glucose, Bld: 125 mg/dL — ABNORMAL HIGH (ref 70–99)
Potassium: 4.1 mEq/L (ref 3.5–5.1)
Sodium: 139 mEq/L (ref 135–145)
Total Bilirubin: 1.2 mg/dL (ref 0.2–1.2)
Total Protein: 7 g/dL (ref 6.0–8.3)

## 2018-11-18 LAB — LDL CHOLESTEROL, DIRECT: Direct LDL: 75 mg/dL

## 2018-11-18 LAB — LIPID PANEL
Cholesterol: 206 mg/dL — ABNORMAL HIGH (ref 0–200)
HDL: 34.2 mg/dL — ABNORMAL LOW (ref >39.00)
Total CHOL/HDL Ratio: 6
Triglycerides: 503 mg/dL — ABNORMAL HIGH (ref 0.0–149.0)

## 2018-11-18 LAB — HEMOGLOBIN A1C: Hgb A1c MFr Bld: 7.6 % — ABNORMAL HIGH (ref 4.6–6.5)

## 2018-11-21 ENCOUNTER — Other Ambulatory Visit: Payer: Self-pay | Admitting: Family Medicine

## 2018-11-21 NOTE — Telephone Encounter (Signed)
Last office visit 08/19/2018 for DM.  Last refilled 08/30/2018 for #90 with no refills.  Next Appt: 11/22/2018 for surgical clearance.

## 2018-11-22 ENCOUNTER — Encounter: Payer: Self-pay | Admitting: *Deleted

## 2018-11-22 ENCOUNTER — Encounter: Payer: Self-pay | Admitting: Family Medicine

## 2018-11-22 ENCOUNTER — Other Ambulatory Visit: Payer: Self-pay

## 2018-11-22 ENCOUNTER — Ambulatory Visit: Payer: Managed Care, Other (non HMO) | Admitting: Family Medicine

## 2018-11-22 VITALS — BP 130/86 | HR 92 | Temp 98.6°F | Ht 69.0 in | Wt 275.5 lb

## 2018-11-22 DIAGNOSIS — Z01818 Encounter for other preprocedural examination: Secondary | ICD-10-CM | POA: Diagnosis not present

## 2018-11-22 DIAGNOSIS — I10 Essential (primary) hypertension: Secondary | ICD-10-CM | POA: Diagnosis not present

## 2018-11-22 DIAGNOSIS — E119 Type 2 diabetes mellitus without complications: Secondary | ICD-10-CM

## 2018-11-22 DIAGNOSIS — E781 Pure hyperglyceridemia: Secondary | ICD-10-CM

## 2018-11-22 DIAGNOSIS — E7800 Pure hypercholesterolemia, unspecified: Secondary | ICD-10-CM

## 2018-11-22 NOTE — Assessment & Plan Note (Signed)
Needs A1C lower prior to surgery. LAst EKG stable. 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 Encouraged post op incentive spirometry given morbid obesity. Encouraged exercise, weight loss, healthy eating habits.

## 2018-11-22 NOTE — Progress Notes (Signed)
Chief Complaint  Patient presents with  . Surgical Clearance    History of Present Illness: HPI  48 year old female with diabetes presents for preop clearance for upcoming right total hip replacement by Dr. Mardelle Matte.  Diabetes:   At recent check A1C was above needed goal < 7.5  On metformin. Lab Results  Component Value Date   HGBA1C 7.6 (H) 11/18/2018  Using medications without difficulties: none Hypoglycemic episodes:? Hyperglycemic episodes:? Feet problems:no uclers Blood Sugars averaging: not checking eye exam within last year:  Hypertension:    Good control  BP Readings from Last 3 Encounters:  11/22/18 130/86  08/19/18 120/90  08/09/18 133/88  Using medication without problems or lightheadedness: none Chest pain with exertion:none Edema:none Short of breath:none Average home BPs: 120/80s Other issues:  Elevated Cholesterol: LDL at goal on atorvastatin. Using medications without problems: Muscle aches:  Diet compliance: Exercise: Other complaints: She has been working more low carb diet.   No exercise given hip and cannot do pool aerobics.  CMET nml.   NML EKG 07/2018   Never had  surgery other than a minor surgery. No snoring at night. No sleep apnea.  COVID 19 screen No recent travel or known exposure to COVID19 The patient denies respiratory symptoms of COVID 19 at this time.  The importance of social distancing was discussed today.   Review of Systems  Constitutional: Negative for chills and fever.  HENT: Negative for congestion and ear pain.   Eyes: Negative for pain and redness.  Respiratory: Negative for cough and shortness of breath.   Cardiovascular: Negative for chest pain, palpitations and leg swelling.  Gastrointestinal: Negative for abdominal pain, blood in stool, constipation, diarrhea, nausea and vomiting.  Genitourinary: Negative for dysuria.  Musculoskeletal: Negative for falls and myalgias.  Skin: Negative for rash.  Neurological:  Negative for dizziness.  Psychiatric/Behavioral: Negative for depression. The patient is not nervous/anxious.       Past Medical History:  Diagnosis Date  . Dermatophytosis of nail   . Diabetes mellitus without complication ([place])   . Elevated blood pressure reading without diagnosis of hypertension   . Ganglion of tendon sheath   . Pain in joint, multiple sites   . PCOS (polycystic ovarian syndrome)   . Pure hypercholesterolemia   . Unspecified disorder of skin and subcutaneous tissue     reports that she has never smoked. She has never used smokeless tobacco. She reports that she does not drink alcohol or use drugs.   Current Outpatient Medications:  .  atorvastatin (LIPITOR) 20 MG tablet, Take 1 tablet (20 mg total) by mouth daily., Disp: 90 tablet, Rfl: 3 .  cyclobenzaprine (FLEXERIL) 10 MG tablet, TAKE 0.5-1 TABLETS (5-10 MG TOTAL) BY MOUTH AT BEDTIME., Disp: 30 tablet, Rfl: 1 .  diclofenac (VOLTAREN) 75 MG EC tablet, TAKE 1 TABLET BY MOUTH TWICE A DAY, Disp: 180 tablet, Rfl: 0 .  gabapentin (NEURONTIN) 300 MG capsule, TAKE 1 CAPSULE BY MOUTH EVERYDAY AT BEDTIME, Disp: 90 capsule, Rfl: 0 .  hydrochlorothiazide (HYDRODIURIL) 25 MG tablet, TAKE 1 TABLET BY MOUTH EVERY DAY WITH LOSARTAN, Disp: 90 tablet, Rfl: 1 .  losartan (COZAAR) 100 MG tablet, TAKE 1 TABLET BY MOUTH EVERY DAY (TAKE WITH HCTZ), Disp: 90 tablet, Rfl: 1 .  metFORMIN (GLUCOPHAGE-XR) 500 MG 24 hr tablet, TAKE 2 TABLETS BY MOUTH EVERY DAY WITH BREAKFAST, Disp: 180 tablet, Rfl: 1 .  Multiple Vitamins-Minerals (MULTIVITAMIN WITH MINERALS) tablet, Take 1 tablet by mouth daily., Disp: , Rfl:  Observations/Objective: Blood pressure 130/86, pulse 92, temperature 98.6 F (37 C), temperature source Oral, height 5\' 9"  (1.753 m), weight 275 lb 8 oz (125 kg).  Physical Exam Constitutional:      General: She is not in acute distress.    Appearance: Normal appearance. She is well-developed. She is obese. She is not ill-appearing  or toxic-appearing.  HENT:     Head: Normocephalic.     Right Ear: Hearing, tympanic membrane, ear canal and external ear normal. Tympanic membrane is not erythematous, retracted or bulging.     Left Ear: Hearing, tympanic membrane, ear canal and external ear normal. Tympanic membrane is not erythematous, retracted or bulging.     Nose: No mucosal edema or rhinorrhea.     Right Sinus: No maxillary sinus tenderness or frontal sinus tenderness.     Left Sinus: No maxillary sinus tenderness or frontal sinus tenderness.     Mouth/Throat:     Pharynx: Uvula midline.  Eyes:     General: Lids are normal. Lids are everted, no foreign bodies appreciated.     Conjunctiva/sclera: Conjunctivae normal.     Pupils: Pupils are equal, round, and reactive to light.  Neck:     Musculoskeletal: Normal range of motion and neck supple.     Thyroid: No thyroid mass or thyromegaly.     Vascular: No carotid bruit.     Trachea: Trachea normal.  Cardiovascular:     Rate and Rhythm: Normal rate and regular rhythm.     Pulses: Normal pulses.     Heart sounds: Normal heart sounds, S1 normal and S2 normal. No murmur. No friction rub. No gallop.   Pulmonary:     Effort: Pulmonary effort is normal. No tachypnea or respiratory distress.     Breath sounds: Normal breath sounds. No decreased breath sounds, wheezing, rhonchi or rales.  Abdominal:     General: Bowel sounds are normal.     Palpations: Abdomen is soft.     Tenderness: There is no abdominal tenderness.  Skin:    General: Skin is warm and dry.     Findings: No rash.  Neurological:     Mental Status: She is alert.  Psychiatric:        Mood and Affect: Mood is not anxious or depressed.        Speech: Speech normal.        Behavior: Behavior normal. Behavior is cooperative.        Thought Content: Thought content normal.        Judgment: Judgment normal.      Assessment and Plan   Preop examination Needs A1C lower prior to surgery. LAst EKG  stable. 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 Encouraged post op incentive spirometry given morbid obesity. Encouraged exercise, weight loss, healthy eating habits.   HTN (hypertension) Well controlled. Continue current medication.   HYPERCHOLESTEROLEMIA, PURE LDL now at goal but trig up off fenofibrate.. she also has been eating more carbs.  Continue statin, no SE. If trigs not at goal.. will need to add back fenofibrate in additiona to statin.   Diabetes mellitus with no complication Not at goal. Pt refuses med increase or change.. will get back on track with exercise, and low carb diet.     Eliezer Lofts, MD

## 2018-11-22 NOTE — Assessment & Plan Note (Signed)
LDL now at goal but trig up off fenofibrate.. she also has been eating more carbs.  Continue statin, no SE. If trigs not at goal.. will need to add back fenofibrate in additiona to statin.

## 2018-11-22 NOTE — Assessment & Plan Note (Signed)
Not at goal. Pt refuses med increase or change.. will get back on track with exercise, and low carb diet.

## 2018-11-22 NOTE — Patient Instructions (Addendum)
Work on getting back to water exercise when able. Try upper body exercise in meantime.  Work on low carb and low  Chol diet.  Stay on atorvastatin.

## 2018-11-22 NOTE — Assessment & Plan Note (Signed)
Well controlled. Continue current medication.  

## 2018-12-03 LAB — HM DIABETES EYE EXAM

## 2018-12-22 ENCOUNTER — Other Ambulatory Visit: Payer: Managed Care, Other (non HMO)

## 2018-12-26 ENCOUNTER — Encounter (HOSPITAL_COMMUNITY): Admission: RE | Admit: 2018-12-26 | Payer: Managed Care, Other (non HMO) | Source: Ambulatory Visit

## 2019-01-04 ENCOUNTER — Other Ambulatory Visit: Payer: Self-pay | Admitting: Family Medicine

## 2019-01-08 ENCOUNTER — Other Ambulatory Visit: Payer: Self-pay | Admitting: Family Medicine

## 2019-01-09 NOTE — Telephone Encounter (Signed)
LOV 11/22/2018 for Pre op. Last filled on 11/04/2018 for 30 with 1 refill. Future appointment on 03/24/2019

## 2019-01-19 ENCOUNTER — Other Ambulatory Visit (INDEPENDENT_AMBULATORY_CARE_PROVIDER_SITE_OTHER): Payer: Managed Care, Other (non HMO)

## 2019-01-19 DIAGNOSIS — E781 Pure hyperglyceridemia: Secondary | ICD-10-CM | POA: Diagnosis not present

## 2019-01-19 DIAGNOSIS — E119 Type 2 diabetes mellitus without complications: Secondary | ICD-10-CM

## 2019-01-19 LAB — LIPID PANEL
Cholesterol: 209 mg/dL — ABNORMAL HIGH (ref 0–200)
HDL: 31.4 mg/dL — ABNORMAL LOW (ref >39.00)
Total CHOL/HDL Ratio: 7
Triglycerides: 550 mg/dL — ABNORMAL HIGH (ref 0.0–149.0)

## 2019-01-19 LAB — HEMOGLOBIN A1C: Hgb A1c MFr Bld: 6.7 % — ABNORMAL HIGH (ref 4.6–6.5)

## 2019-01-19 LAB — LDL CHOLESTEROL, DIRECT: Direct LDL: 87 mg/dL

## 2019-01-20 ENCOUNTER — Other Ambulatory Visit: Payer: Self-pay | Admitting: Family Medicine

## 2019-01-20 NOTE — Telephone Encounter (Signed)
Last office visit 11/22/2018 for Pre-Op Visit.  Last refilled 10/26/2018 for #180 with no refills.  Next Appt: 03/24/2019 for 4 month follow up.

## 2019-01-20 NOTE — Progress Notes (Signed)
No critical labs need to be addressed urgently. We will discuss labs in detail at upcoming office visit.   

## 2019-01-23 NOTE — Progress Notes (Signed)
MEDICAL CLEARANCE NOTE AMY BEDSOLE 11-22-18 Epic HEMAGLOBIN A1C 01-19-19 Epic EKG 08-05-18 EPIC

## 2019-01-23 NOTE — Patient Instructions (Addendum)
YOU NEED TO HAVE A COVID 19 TEST ON_____FRIDAY 7th__ @__9 :00am_____, THIS TEST MUST BE DONE BEFORE SURGERY, COME  [place] , [address]. ONCE YOUR COVID TEST IS COMPLETED, PLEASE BEGIN THE QUARANTINE INSTRUCTIONS AS OUTLINED IN YOUR HANDOUT.                 01/23/2019    Your procedure is scheduled on: 01-31-2019  Report to [facility] Main  Entrance    Report to admitting at 8:20  AM   1 VISITOR IS ALLOWED TO WAIT IN WAITING ROOM  ONLY DAY OF YOUR SURGERY.    Call this number if you have problems the morning of surgery [phone]    Remember:BRUSH YOUR TEETH MORNING OF SURGERY AND RINSE YOUR MOUTH OUT, NO CHEWING GUM CANDY OR MINTS.   NO SOLID FOOD AFTER MIDNIGHT THE NIGHT PRIOR TO SURGERY. NOTHING BY MOUTH EXCEPT CLEAR LIQUIDS UNTIL 7:52 AM. PLEASE FINISH  G2 DRINK PER SURGEON ORDER 3 HOURS PRIOR TO SCHEDULED SURGERY TIME WHICH NEEDS TO BE COMPLETED AT  7:52 AM.   CLEAR LIQUID DIET   Foods Allowed                                                                     Foods Excluded  Coffee and tea, regular and decaf                             liquids that you cannot  Plain Jell-O any favor except red or purple                                           see through such as: Fruit ices (not with fruit pulp)                                     milk, soups, orange juice  Iced Popsicles                                    All solid food Carbonated beverages, regular and diet                                    Cranberry, grape and apple juices Sports drinks like Gatorade Lightly seasoned clear broth or consume(fat free) Sugar, honey syrup  Sample Menu Breakfast                                Lunch                                     Supper Cranberry juice                    Beef  broth                            Chicken broth Jell-O                                     Grape juice                           Apple juice Coffee or tea                         Jell-O                                      Popsicle                                                Coffee or tea                        Coffee or tea  _____________________________________________________________________     Take these medicines the morning of surgery with A SIP OF WATER: tylenol if needed   DO NOT TAKE ANY DIABETIC MEDICATIONS DAY OF YOUR SURGERY   How to Manage Your Diabetes Before and After Surgery  Why is it important to control my blood sugar before and after surgery? . Improving blood sugar levels before and after surgery helps healing and can limit problems. . A way of improving blood sugar control is eating a healthy diet by: o  Eating less sugar and carbohydrates o  Increasing activity/exercise o  Talking with your doctor about reaching your blood sugar goals . High blood sugars (greater than 180 mg/dL) can raise your risk of infections and slow your recovery, so you will need to focus on controlling your diabetes during the weeks before surgery. . Make sure that the doctor who takes care of your diabetes knows about your planned surgery including the date and location.  How do I manage my blood sugar before surgery? . Check your blood sugar at least 4 times a day, starting 2 days before surgery, to make sure that the level is not too high or low. o Check your blood sugar the morning of your surgery when you wake up and every 2 hours until you get to the Short Stay unit. . If your blood sugar is less than 70 mg/dL, you will need to treat for low blood sugar: o Do not take insulin. o Treat a low blood sugar (less than 70 mg/dL) with  cup of clear juice (cranberry or apple), 4 glucose tablets, OR glucose gel. o Recheck blood sugar in 15 minutes after treatment (to make sure it is greater than 70 mg/dL). If your blood sugar is not greater than 70 mg/dL on recheck, call [phone] for further instructions. . Report your blood sugar to the short stay nurse when  you get to Short Stay.  . If you are admitted to the hospital after surgery: o Your blood sugar will be checked by the staff and you will probably be given insulin after surgery (instead  of oral diabetes medicines) to make sure you have good blood sugar levels. o The goal for blood sugar control after surgery is 80-180 mg/dL.   WHAT DO I DO ABOUT MY DIABETES MEDICATION?     THE MORNING OF SURGERY DO NOT TAKE METFORMIN                             You may not have any metal on your body including hair pins and              piercings  Do not wear jewelry, make-up, lotions, powders or perfumes, deodorant             Do not wear nail polish.  Do not shave  48 hours prior to surgery.              Men may shave face and neck.   Do not bring valuables to the hospital. [place].  Contacts, dentures or bridgework may not be worn into surgery.  Leave suitcase in the car. After surgery it may be brought to your room.     Patients discharged the day of surgery will not be allowed to drive home. IF YOU ARE HAVING SURGERY AND GOING HOME THE SAME DAY, YOU MUST HAVE AN ADULT TO DRIVE YOU HOME AND BE WITH YOU FOR 24 HOURS. YOU MAY GO HOME BY TAXI OR UBER OR ORTHERWISE, BUT AN ADULT MUST ACCOMPANY YOU HOME AND STAY WITH YOU FOR 24 HOURS.  Name and phone number of your driver:  Special Instructions: N/A              Please read over the following fact sheets you were given: _____________________________________________________________________             [facility] - Preparing for Surgery Before surgery, you can play an important role.  Because skin is not sterile, your skin needs to be as free of germs as possible.  You can reduce the number of germs on your skin by washing with CHG (chlorahexidine gluconate) soap before surgery.  CHG is an antiseptic cleaner which kills germs and bonds with the skin to continue killing germs even after  washing. Please DO NOT use if you have an allergy to CHG or antibacterial soaps.  If your skin becomes reddened/irritated stop using the CHG and inform your nurse when you arrive at Short Stay. Do not shave (including legs and underarms) for at least 48 hours prior to the first CHG shower.  You may shave your face/neck. Please follow these instructions carefully:  1.  Shower with CHG Soap the night before surgery and the  morning of Surgery.  2.  If you choose to wash your hair, wash your hair first as usual with your  normal  shampoo.  3.  After you shampoo, rinse your hair and body thoroughly to remove the  shampoo.                           4.  Use CHG as you would any other liquid soap.  You can apply chg directly  to the skin and wash                       Gently with a scrungie or clean  washcloth.  5.  Apply the CHG Soap to your body ONLY FROM THE NECK DOWN.   Do not use on face/ open                           Wound or open sores. Avoid contact with eyes, ears mouth and genitals (private parts).                       Wash face,  Genitals (private parts) with your normal soap.             6.  Wash thoroughly, paying special attention to the area where your surgery  will be performed.  7.  Thoroughly rinse your body with warm water from the neck down.  8.  DO NOT shower/wash with your normal soap after using and rinsing off  the CHG Soap.                9.  Pat yourself dry with a clean towel.            10.  Wear clean pajamas.            11.  Place clean sheets on your bed the night of your first shower and do not  sleep with pets. Day of Surgery : Do not apply any lotions/deodorants the morning of surgery.  Please wear clean clothes to the hospital/surgery center.  FAILURE TO FOLLOW THESE INSTRUCTIONS MAY RESULT IN THE CANCELLATION OF YOUR SURGERY PATIENT SIGNATURE_________________________________  NURSE  SIGNATURE__________________________________  ________________________________________________________________________   Adam Phenix  An incentive spirometer is a tool that can help keep your lungs clear and active. This tool measures how well you are filling your lungs with each breath. Taking long deep breaths may help reverse or decrease the chance of developing breathing (pulmonary) problems (especially infection) following:  A long period of time when you are unable to move or be active. BEFORE THE PROCEDURE   If the spirometer includes an indicator to show your best effort, your nurse or respiratory therapist will set it to a desired goal.  If possible, sit up straight or lean slightly forward. Try not to slouch.  Hold the incentive spirometer in an upright position. INSTRUCTIONS FOR USE  1. Sit on the edge of your bed if possible, or sit up as far as you can in bed or on a chair. 2. Hold the incentive spirometer in an upright position. 3. Breathe out normally. 4. Place the mouthpiece in your mouth and seal your lips tightly around it. 5. Breathe in slowly and as deeply as possible, raising the piston or the ball toward the top of the column. 6. Hold your breath for 3-5 seconds or for as long as possible. Allow the piston or ball to fall to the bottom of the column. 7. Remove the mouthpiece from your mouth and breathe out normally. 8. Rest for a few seconds and repeat Steps 1 through 7 at least 10 times every 1-2 hours when you are awake. Take your time and take a few normal breaths between deep breaths. 9. The spirometer may include an indicator to show your best effort. Use the indicator as a goal to work toward during each repetition. 10. After each set of 10 deep breaths, practice coughing to be sure your lungs are clear. If you have an incision (the cut made at the time of surgery), support your incision when coughing by  placing a pillow or rolled up towels firmly  against it. Once you are able to get out of bed, walk around indoors and cough well. You may stop using the incentive spirometer when instructed by your caregiver.  RISKS AND COMPLICATIONS  Take your time so you do not get dizzy or light-headed.  If you are in pain, you may need to take or ask for pain medication before doing incentive spirometry. It is harder to take a deep breath if you are having pain. AFTER USE  Rest and breathe slowly and easily.  It can be helpful to keep track of a log of your progress. Your caregiver can provide you with a simple table to help with this. If you are using the spirometer at home, follow these instructions: [place] IF:   You are having difficultly using the spirometer.  You have trouble using the spirometer as often as instructed.  Your pain medication is not giving enough relief while using the spirometer.  You develop fever of 100.5 F (38.1 C) or higher. SEEK IMMEDIATE MEDICAL CARE IF:   You cough up bloody sputum that had not been present before.  You develop fever of 102 F (38.9 C) or greater.  You develop worsening pain at or near the incision site. MAKE SURE YOU:   Understand these instructions.  Will watch your condition.  Will get help right away if you are not doing well or get worse. Document Released: 10/19/2006 Document Revised: 08/31/2011 Document Reviewed: 12/20/2006 [facility] Patient Information 2014 [place], [place].   ________________________________________________________________________

## 2019-01-24 ENCOUNTER — Encounter (HOSPITAL_COMMUNITY)
Admission: RE | Admit: 2019-01-24 | Discharge: 2019-01-24 | Disposition: A | Discharge status: Home / Self Care | Payer: Managed Care, Other (non HMO) | Source: Ambulatory Visit | Attending: Orthopedic Surgery | Admitting: Orthopedic Surgery

## 2019-01-24 ENCOUNTER — Encounter (HOSPITAL_COMMUNITY): Payer: Self-pay

## 2019-01-24 ENCOUNTER — Other Ambulatory Visit: Payer: Self-pay

## 2019-01-24 DIAGNOSIS — E119 Type 2 diabetes mellitus without complications: Secondary | ICD-10-CM | POA: Diagnosis not present

## 2019-01-24 DIAGNOSIS — Z20828 Contact with and (suspected) exposure to other viral communicable diseases: Secondary | ICD-10-CM | POA: Insufficient documentation

## 2019-01-24 DIAGNOSIS — Z79899 Other long term (current) drug therapy: Secondary | ICD-10-CM | POA: Diagnosis not present

## 2019-01-24 DIAGNOSIS — I10 Essential (primary) hypertension: Secondary | ICD-10-CM | POA: Insufficient documentation

## 2019-01-24 DIAGNOSIS — Z01818 Encounter for other preprocedural examination: Secondary | ICD-10-CM | POA: Insufficient documentation

## 2019-01-24 DIAGNOSIS — Z7984 Long term (current) use of oral hypoglycemic drugs: Secondary | ICD-10-CM | POA: Diagnosis not present

## 2019-01-24 DIAGNOSIS — E7800 Pure hypercholesterolemia, unspecified: Secondary | ICD-10-CM | POA: Diagnosis not present

## 2019-01-24 DIAGNOSIS — M1611 Unilateral primary osteoarthritis, right hip: Secondary | ICD-10-CM | POA: Diagnosis not present

## 2019-01-24 HISTORY — DX: Other seasonal allergic rhinitis: J30.2

## 2019-01-24 HISTORY — DX: Unspecified osteoarthritis, unspecified site: M19.90

## 2019-01-24 LAB — BASIC METABOLIC PANEL
Anion gap: 11 (ref 5–15)
BUN: 21 mg/dL — ABNORMAL HIGH (ref 6–20)
CO2: 25 mmol/L (ref 22–32)
Calcium: 9.3 mg/dL (ref 8.9–10.3)
Chloride: 103 mmol/L (ref 98–111)
Creatinine, Ser: 1.15 mg/dL — ABNORMAL HIGH (ref 0.44–1.00)
GFR calc Af Amer: >60 mL/min (ref >60)
GFR calc non Af Amer: 56 mL/min — ABNORMAL LOW (ref >60)
Glucose, Bld: 186 mg/dL — ABNORMAL HIGH (ref 70–99)
Potassium: 3.7 mmol/L (ref 3.5–5.1)
Sodium: 139 mmol/L (ref 135–145)

## 2019-01-24 LAB — CBC
HCT: 41.5 % (ref 36.0–46.0)
Hemoglobin: 13.5 g/dL (ref 12.0–15.0)
MCH: 31.3 pg (ref 26.0–34.0)
MCHC: 32.5 g/dL (ref 30.0–36.0)
MCV: 96.1 fL (ref 80.0–100.0)
Platelets: 194 K/uL (ref 150–400)
RBC: 4.32 MIL/uL (ref 3.87–5.11)
RDW: 12.6 % (ref 11.5–15.5)
WBC: 5.9 K/uL (ref 4.0–10.5)
nRBC: 0 % (ref 0.0–0.2)

## 2019-01-24 LAB — SURGICAL PCR SCREEN
MRSA, PCR: NEGATIVE
Staphylococcus aureus: NEGATIVE

## 2019-01-24 LAB — GLUCOSE, CAPILLARY: Glucose-Capillary: 165 mg/dL — ABNORMAL HIGH (ref 70–99)

## 2019-01-26 NOTE — Progress Notes (Signed)
Anesthesia Chart Review   Case: [id number] Date/Time: 01/31/19 1037   Procedure: TOTAL HIP ARTHROPLASTY (Right )   Anesthesia type: General   Pre-op diagnosis: djd right hip   Location: [place] / [facility]   Surgeon: Marchia Bond, MD      DISCUSSION:48 y.o. never smoker with h/o DM II, HTN, right hip djd scheduled for above procedure 01/31/2019 with Dr. Marchia Bond.   Pt seen by PCP, Dr. Camillia Herter, 11/22/2018 for preoperative evaluation.  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 Encouraged post op incentive spirometry given morbid obesity."  Last A1C 6.7 on 01/19/2019.   Anticipate pt can proceed with planned procedure barring acute status change.   VS: BP (!) 158/87 (BP Location: Left Arm) Comment: Left Arm  Pulse 90   Temp 37 C (Oral)   Resp 20   Ht 5\' 9"  (1.753 m)   Wt 122.6 kg   SpO2 98%   BMI 39.92 kg/m   PROVIDERS: Jinny Sanders, MD is PCP    LABS: Labs reviewed: Acceptable for surgery. (all labs ordered are listed, but only abnormal results are displayed)  Labs Reviewed  GLUCOSE, CAPILLARY - Abnormal; Notable for the following components:      Result Value   Glucose-Capillary 165 (*)    All other components within normal limits  BASIC METABOLIC PANEL - Abnormal; Notable for the following components:   Glucose, Bld 186 (*)    BUN 21 (*)    Creatinine, Ser 1.15 (*)    GFR calc non Af Amer 56 (*)    All other components within normal limits  SURGICAL PCR SCREEN  CBC     IMAGES:   EKG: 08/05/2018 Rate 87 bpm Normal sinus rhythm   CV:  Past Medical History:  Diagnosis Date  . Arthritis   . Dermatophytosis of nail   . Diabetes mellitus without complication ([place])    type 2  ,  does not monitor cbgs  at home    . Elevated blood pressure reading without diagnosis of hypertension   . Ganglion of tendon sheath   . Pain in joint, multiple sites   . PCOS (polycystic ovarian syndrome)   . Pure hypercholesterolemia    . Seasonal allergies   . Unspecified disorder of skin and subcutaneous tissue     Past Surgical History:  Procedure Laterality Date  . MASS EXCISION Right 08/09/2018   Procedure: EXCISION MASS RIGHT THUMB NAILBED;  Surgeon: Daryll Brod, MD;  Location: [place];  Service: Orthopedics;  Laterality: Right;  FAB  . NO PAST SURGERIES    . TONSILLECTOMY  age 89    MEDICATIONS: . levonorgestrel (MIRENA) 20 MCG/24HR IUD  . acetaminophen (TYLENOL) 500 MG tablet  . atorvastatin (LIPITOR) 20 MG tablet  . cyclobenzaprine (FLEXERIL) 10 MG tablet  . diclofenac (VOLTAREN) 75 MG EC tablet  . gabapentin (NEURONTIN) 300 MG capsule  . GLUCOSAMINE-CHONDROITIN PO  . hydrochlorothiazide (HYDRODIURIL) 25 MG tablet  . losartan (COZAAR) 100 MG tablet  . metFORMIN (GLUCOPHAGE-XR) 500 MG 24 hr tablet  . Multiple Vitamins-Minerals (MULTIVITAMIN WITH MINERALS) tablet  . triamcinolone (NASACORT ALLERGY 24HR) 55 MCG/ACT AERO nasal inhaler   No current facility-administered medications for this encounter.     Maia Plan [facility] Pre-Surgical Testing [phone] 01/26/19  11:03 AM

## 2019-01-26 NOTE — Anesthesia Preprocedure Evaluation (Addendum)
Anesthesia Evaluation  Patient identified by MRN, date of birth, ID band Patient awake    Reviewed: Allergy & Precautions, NPO status , Patient's Chart, lab work & pertinent test results  Airway Mallampati: III  TM Distance: >3 FB     Dental no notable dental hx. (+) Teeth Intact   Pulmonary neg pulmonary ROS,    Pulmonary exam normal breath sounds clear to auscultation       Cardiovascular hypertension, Pt. on medications Normal cardiovascular exam Rhythm:Regular Rate:Normal     Neuro/Psych negative neurological ROS  negative psych ROS   GI/Hepatic negative GI ROS, Neg liver ROS,   Endo/Other  diabetes, Type 2, Oral Hypoglycemic AgentsMorbid obesity  Renal/GU      Musculoskeletal   Abdominal (+) + obese,   Peds  Hematology negative hematology ROS (+)   Anesthesia Other Findings   Reproductive/Obstetrics                           Anesthesia Physical Anesthesia Plan  ASA: III  Anesthesia Plan: Spinal   Post-op Pain Management:    Induction:   PONV Risk Score and Plan: 3 and Ondansetron and Midazolam  Airway Management Planned: Natural Airway and Nasal Cannula  Additional Equipment:   Intra-op Plan:   Post-operative Plan:   Informed Consent: I have reviewed the patients History and Physical, chart, labs and discussed the procedure including the risks, benefits and alternatives for the proposed anesthesia with the patient or authorized representative who has indicated his/her understanding and acceptance.       Plan Discussed with: CRNA  Anesthesia Plan Comments: (See PAT note 01/24/2019, Konrad Felix, PA-C)       Anesthesia Quick Evaluation

## 2019-01-27 ENCOUNTER — Other Ambulatory Visit (HOSPITAL_COMMUNITY)
Admission: RE | Admit: 2019-01-27 | Discharge: 2019-01-27 | Disposition: A | Discharge status: Home / Self Care | Payer: Managed Care, Other (non HMO) | Source: Ambulatory Visit | Attending: Orthopedic Surgery | Admitting: Orthopedic Surgery

## 2019-01-27 DIAGNOSIS — Z01818 Encounter for other preprocedural examination: Secondary | ICD-10-CM | POA: Diagnosis not present

## 2019-01-27 LAB — SARS CORONAVIRUS 2 (TAT 6-24 HRS): SARS Coronavirus 2: NEGATIVE

## 2019-01-30 MED ORDER — DEXTROSE 5 % IV SOLN
3.0000 g | INTRAVENOUS | Status: AC
  Administered 2019-01-31: 3 g via INTRAVENOUS
  Filled 2019-01-30: qty 3

## 2019-01-31 ENCOUNTER — Encounter (HOSPITAL_COMMUNITY)
Admission: RE | Disposition: A | Discharge status: Home / Self Care | Payer: Self-pay | Source: Other Acute Inpatient Hospital | Attending: Orthopedic Surgery

## 2019-01-31 ENCOUNTER — Other Ambulatory Visit: Payer: Self-pay

## 2019-01-31 ENCOUNTER — Inpatient Hospital Stay (HOSPITAL_COMMUNITY): Payer: Managed Care, Other (non HMO)

## 2019-01-31 ENCOUNTER — Encounter (HOSPITAL_COMMUNITY): Payer: Self-pay | Admitting: Anesthesiology

## 2019-01-31 ENCOUNTER — Inpatient Hospital Stay (HOSPITAL_COMMUNITY): Payer: Managed Care, Other (non HMO) | Admitting: Anesthesiology

## 2019-01-31 ENCOUNTER — Other Ambulatory Visit: Payer: Self-pay | Admitting: Family Medicine

## 2019-01-31 ENCOUNTER — Inpatient Hospital Stay (HOSPITAL_COMMUNITY)
Admission: RE | Admit: 2019-01-31 | Discharge: 2019-02-01 | DRG: 470 | Disposition: A | Discharge status: Home / Self Care | Payer: Managed Care, Other (non HMO) | Source: Other Acute Inpatient Hospital | Attending: Orthopedic Surgery | Admitting: Orthopedic Surgery

## 2019-01-31 ENCOUNTER — Inpatient Hospital Stay (HOSPITAL_COMMUNITY): Payer: Managed Care, Other (non HMO) | Admitting: Physician Assistant

## 2019-01-31 DIAGNOSIS — I10 Essential (primary) hypertension: Secondary | ICD-10-CM | POA: Diagnosis present

## 2019-01-31 DIAGNOSIS — Z6839 Body mass index (BMI) 39.0-39.9, adult: Secondary | ICD-10-CM | POA: Diagnosis not present

## 2019-01-31 DIAGNOSIS — Z7982 Long term (current) use of aspirin: Secondary | ICD-10-CM

## 2019-01-31 DIAGNOSIS — Z823 Family history of stroke: Secondary | ICD-10-CM

## 2019-01-31 DIAGNOSIS — M1631 Unilateral osteoarthritis resulting from hip dysplasia, right hip: Secondary | ICD-10-CM | POA: Diagnosis present

## 2019-01-31 DIAGNOSIS — Z7984 Long term (current) use of oral hypoglycemic drugs: Secondary | ICD-10-CM | POA: Diagnosis not present

## 2019-01-31 DIAGNOSIS — E669 Obesity, unspecified: Secondary | ICD-10-CM | POA: Diagnosis present

## 2019-01-31 DIAGNOSIS — E119 Type 2 diabetes mellitus without complications: Secondary | ICD-10-CM | POA: Diagnosis present

## 2019-01-31 DIAGNOSIS — Z96649 Presence of unspecified artificial hip joint: Secondary | ICD-10-CM

## 2019-01-31 HISTORY — DX: Allergy, unspecified, initial encounter: T78.40XA

## 2019-01-31 HISTORY — DX: Unilateral osteoarthritis resulting from hip dysplasia, right hip: M16.31

## 2019-01-31 HISTORY — PX: TOTAL HIP ARTHROPLASTY: SHX124

## 2019-01-31 LAB — GLUCOSE, CAPILLARY
Glucose-Capillary: 153 mg/dL — ABNORMAL HIGH (ref 70–99)
Glucose-Capillary: 122 mg/dL — ABNORMAL HIGH (ref 70–99)
Glucose-Capillary: 220 mg/dL — ABNORMAL HIGH (ref 70–99)

## 2019-01-31 SURGERY — ARTHROPLASTY, HIP, TOTAL,POSTERIOR APPROACH
Anesthesia: Spinal | Laterality: Right

## 2019-01-31 MED ORDER — SODIUM CHLORIDE 0.9 % IR SOLN
Status: DC | PRN
  Administered 2019-01-31: 1000 mL

## 2019-01-31 MED ORDER — MEPERIDINE HCL 50 MG/ML IJ SOLN: 6.2500 mg | INTRAMUSCULAR | Status: DC | PRN

## 2019-01-31 MED ORDER — METHOCARBAMOL 500 MG PO TABS
ORAL_TABLET | ORAL | Status: AC
  Filled 2019-01-31: qty 1

## 2019-01-31 MED ORDER — PHENOL 1.4 % MT LIQD: 1.0000 | OROMUCOSAL | Status: DC | PRN

## 2019-01-31 MED ORDER — KETOROLAC TROMETHAMINE 15 MG/ML IJ SOLN
7.5000 mg | Freq: Four times a day (QID) | INTRAMUSCULAR | Status: AC
  Administered 2019-01-31 – 2019-02-01 (×3): 7.5 mg via INTRAVENOUS
  Filled 2019-01-31 (×5): qty 1

## 2019-01-31 MED ORDER — ALUM & MAG HYDROXIDE-SIMETH 200-200-20 MG/5ML PO SUSP: 30.0000 mL | ORAL | Status: DC | PRN

## 2019-01-31 MED ORDER — PROMETHAZINE HCL 25 MG/ML IJ SOLN: 6.2500 mg | INTRAMUSCULAR | Status: DC | PRN

## 2019-01-31 MED ORDER — MIDAZOLAM HCL 2 MG/2ML IJ SOLN
INTRAMUSCULAR | Status: AC
  Filled 2019-01-31: qty 2

## 2019-01-31 MED ORDER — LACTATED RINGERS IV SOLN
INTRAVENOUS | Status: DC | PRN
  Administered 2019-01-31 (×3): via INTRAVENOUS

## 2019-01-31 MED ORDER — OXYCODONE HCL 5 MG PO TABS
5.0000 mg | ORAL_TABLET | ORAL | Status: DC | PRN
  Administered 2019-01-31: 10 mg via ORAL
  Administered 2019-01-31: 5 mg via ORAL
  Administered 2019-02-01: 10 mg via ORAL
  Administered 2019-02-01: 5 mg via ORAL
  Filled 2019-01-31 (×4): qty 2

## 2019-01-31 MED ORDER — MENTHOL 3 MG MT LOZG: 1.0000 | LOZENGE | OROMUCOSAL | Status: DC | PRN

## 2019-01-31 MED ORDER — POTASSIUM CHLORIDE IN NACL 20-0.45 MEQ/L-% IV SOLN
INTRAVENOUS | Status: DC
  Administered 2019-01-31: 18:00:00 via INTRAVENOUS
  Filled 2019-01-31 (×2): qty 1000

## 2019-01-31 MED ORDER — DEXAMETHASONE SODIUM PHOSPHATE 10 MG/ML IJ SOLN
INTRAMUSCULAR | Status: DC | PRN
  Administered 2019-01-31: 4 mg via INTRAVENOUS

## 2019-01-31 MED ORDER — MIDAZOLAM HCL 5 MG/5ML IJ SOLN
INTRAMUSCULAR | Status: DC | PRN
  Administered 2019-01-31: 1 mg via INTRAVENOUS
  Administered 2019-01-31: 2 mg via INTRAVENOUS
  Administered 2019-01-31: 1 mg via INTRAVENOUS

## 2019-01-31 MED ORDER — BISACODYL 10 MG RE SUPP: 10.0000 mg | Freq: Every day | RECTAL | Status: DC | PRN

## 2019-01-31 MED ORDER — MAGNESIUM CITRATE PO SOLN: 1.0000 | Freq: Once | ORAL | Status: DC | PRN

## 2019-01-31 MED ORDER — PROPOFOL 500 MG/50ML IV EMUL
INTRAVENOUS | Status: DC | PRN
  Administered 2019-01-31: 75 ug/kg/min via INTRAVENOUS

## 2019-01-31 MED ORDER — GABAPENTIN 300 MG PO CAPS
300.0000 mg | ORAL_CAPSULE | Freq: Every day | ORAL | Status: DC
  Administered 2019-01-31: 300 mg via ORAL
  Filled 2019-01-31: qty 1

## 2019-01-31 MED ORDER — ASPIRIN EC 325 MG PO TBEC
325.0000 mg | DELAYED_RELEASE_TABLET | Freq: Two times a day (BID) | ORAL | Status: DC
  Administered 2019-02-01: 325 mg via ORAL
  Filled 2019-01-31: qty 1

## 2019-01-31 MED ORDER — LEVONORGESTREL 20 MCG/24HR IU IUD: 1.0000 | INTRAUTERINE_SYSTEM | Freq: Once | INTRAUTERINE | Status: DC

## 2019-01-31 MED ORDER — ONDANSETRON HCL 4 MG PO TABS: 4.0000 mg | ORAL_TABLET | Freq: Four times a day (QID) | ORAL | Status: DC | PRN

## 2019-01-31 MED ORDER — SODIUM CHLORIDE 0.9 % IV SOLN
INTRAVENOUS | Status: DC | PRN
  Administered 2019-01-31: 15 ug/min via INTRAVENOUS

## 2019-01-31 MED ORDER — TRIAMCINOLONE ACETONIDE 55 MCG/ACT NA AERO: 2.0000 | INHALATION_SPRAY | Freq: Every day | NASAL | Status: DC

## 2019-01-31 MED ORDER — ACETAMINOPHEN 500 MG PO TABS
1000.0000 mg | ORAL_TABLET | Freq: Once | ORAL | Status: AC
  Administered 2019-01-31: 1000 mg via ORAL
  Filled 2019-01-31: qty 2

## 2019-01-31 MED ORDER — OXYCODONE HCL 5 MG PO TABS
ORAL_TABLET | ORAL | Status: AC
  Filled 2019-01-31: qty 1

## 2019-01-31 MED ORDER — LOSARTAN POTASSIUM 50 MG PO TABS
100.0000 mg | ORAL_TABLET | Freq: Every day | ORAL | Status: DC
  Administered 2019-02-01: 100 mg via ORAL
  Filled 2019-01-31 (×2): qty 2

## 2019-01-31 MED ORDER — CYCLOBENZAPRINE HCL 10 MG PO TABS: 5.0000 mg | ORAL_TABLET | Freq: Three times a day (TID) | ORAL | 0 refills | Status: DC | PRN

## 2019-01-31 MED ORDER — ALBUMIN HUMAN 5 % IV SOLN
INTRAVENOUS | Status: AC
  Filled 2019-01-31: qty 250

## 2019-01-31 MED ORDER — DEXAMETHASONE SODIUM PHOSPHATE 10 MG/ML IJ SOLN
10.0000 mg | Freq: Once | INTRAMUSCULAR | Status: AC
  Administered 2019-02-01: 08:00:00 10 mg via INTRAVENOUS
  Filled 2019-01-31: qty 1

## 2019-01-31 MED ORDER — BUPIVACAINE HCL (PF) 0.25 % IJ SOLN
INTRAMUSCULAR | Status: DC | PRN
  Administered 2019-01-31: 30 mL

## 2019-01-31 MED ORDER — FENTANYL CITRATE (PF) 100 MCG/2ML IJ SOLN
INTRAMUSCULAR | Status: AC
  Filled 2019-01-31: qty 2

## 2019-01-31 MED ORDER — ACETAMINOPHEN 325 MG PO TABS: 325.0000 mg | ORAL_TABLET | Freq: Four times a day (QID) | ORAL | Status: DC | PRN

## 2019-01-31 MED ORDER — HYDROCHLOROTHIAZIDE 25 MG PO TABS
25.0000 mg | ORAL_TABLET | Freq: Every day | ORAL | Status: DC
  Administered 2019-02-01: 08:00:00 25 mg via ORAL
  Filled 2019-01-31: qty 1

## 2019-01-31 MED ORDER — HYDROMORPHONE HCL 1 MG/ML IJ SOLN
0.5000 mg | INTRAMUSCULAR | Status: DC | PRN
  Administered 2019-01-31: 0.5 mg via INTRAVENOUS

## 2019-01-31 MED ORDER — POVIDONE-IODINE 10 % EX SWAB
2.0000 "application " | Freq: Once | CUTANEOUS | Status: AC
  Administered 2019-01-31: 2 via TOPICAL

## 2019-01-31 MED ORDER — OXYCODONE HCL 5 MG PO TABS: 5.0000 mg | ORAL_TABLET | ORAL | 0 refills | Status: DC | PRN

## 2019-01-31 MED ORDER — KETOROLAC TROMETHAMINE 30 MG/ML IJ SOLN: 30.0000 mg | Freq: Once | INTRAMUSCULAR | Status: DC | PRN

## 2019-01-31 MED ORDER — FLUTICASONE PROPIONATE 50 MCG/ACT NA SUSP
2.0000 | Freq: Every day | NASAL | Status: DC
  Filled 2019-01-31: qty 16

## 2019-01-31 MED ORDER — PROPOFOL 10 MG/ML IV BOLUS
INTRAVENOUS | Status: AC
  Filled 2019-01-31: qty 20

## 2019-01-31 MED ORDER — DOCUSATE SODIUM 100 MG PO CAPS
100.0000 mg | ORAL_CAPSULE | Freq: Two times a day (BID) | ORAL | Status: DC
  Administered 2019-01-31 – 2019-02-01 (×2): 100 mg via ORAL
  Filled 2019-01-31 (×2): qty 1

## 2019-01-31 MED ORDER — METHOCARBAMOL 500 MG PO TABS
500.0000 mg | ORAL_TABLET | Freq: Four times a day (QID) | ORAL | Status: DC | PRN
  Administered 2019-01-31: 14:00:00 500 mg via ORAL
  Filled 2019-01-31 (×2): qty 1

## 2019-01-31 MED ORDER — ATORVASTATIN CALCIUM 20 MG PO TABS
20.0000 mg | ORAL_TABLET | Freq: Every day | ORAL | Status: DC
  Administered 2019-02-01: 08:00:00 20 mg via ORAL
  Filled 2019-01-31: qty 1

## 2019-01-31 MED ORDER — METOCLOPRAMIDE HCL 5 MG/ML IJ SOLN: 5.0000 mg | Freq: Three times a day (TID) | INTRAMUSCULAR | Status: DC | PRN

## 2019-01-31 MED ORDER — OXYCODONE HCL 5 MG PO TABS: 10.0000 mg | ORAL_TABLET | ORAL | Status: DC | PRN

## 2019-01-31 MED ORDER — ZOLPIDEM TARTRATE 5 MG PO TABS: 5.0000 mg | ORAL_TABLET | Freq: Every evening | ORAL | Status: DC | PRN

## 2019-01-31 MED ORDER — ALBUMIN HUMAN 5 % IV SOLN
INTRAVENOUS | Status: DC | PRN
  Administered 2019-01-31: 11:00:00 via INTRAVENOUS

## 2019-01-31 MED ORDER — CEFAZOLIN SODIUM-DEXTROSE 2-4 GM/100ML-% IV SOLN
2.0000 g | Freq: Four times a day (QID) | INTRAVENOUS | Status: AC
  Administered 2019-01-31 (×2): 2 g via INTRAVENOUS
  Filled 2019-01-31 (×2): qty 100

## 2019-01-31 MED ORDER — HYDROMORPHONE HCL 1 MG/ML IJ SOLN
INTRAMUSCULAR | Status: AC
  Filled 2019-01-31: qty 1

## 2019-01-31 MED ORDER — HYDROMORPHONE HCL 1 MG/ML IJ SOLN: 0.2500 mg | INTRAMUSCULAR | Status: DC | PRN

## 2019-01-31 MED ORDER — METHOCARBAMOL 500 MG IVPB - SIMPLE MED
500.0000 mg | Freq: Four times a day (QID) | INTRAVENOUS | Status: DC | PRN
  Filled 2019-01-31: qty 50

## 2019-01-31 MED ORDER — ONDANSETRON HCL 4 MG PO TABS: 4.0000 mg | ORAL_TABLET | Freq: Three times a day (TID) | ORAL | 0 refills | Status: DC | PRN

## 2019-01-31 MED ORDER — BUPIVACAINE IN DEXTROSE 0.75-8.25 % IT SOLN
INTRATHECAL | Status: DC | PRN
  Administered 2019-01-31: 1.8 mL via INTRATHECAL

## 2019-01-31 MED ORDER — KETOROLAC TROMETHAMINE 30 MG/ML IJ SOLN
INTRAMUSCULAR | Status: DC | PRN
  Administered 2019-01-31: 30 mg via INTRAVENOUS

## 2019-01-31 MED ORDER — SENNA-DOCUSATE SODIUM 8.6-50 MG PO TABS: 2.0000 | ORAL_TABLET | Freq: Every day | ORAL | 1 refills | Status: DC

## 2019-01-31 MED ORDER — LACTATED RINGERS IV SOLN
INTRAVENOUS | Status: DC
  Administered 2019-01-31: 09:00:00 via INTRAVENOUS

## 2019-01-31 MED ORDER — BUPIVACAINE HCL (PF) 0.25 % IJ SOLN
INTRAMUSCULAR | Status: AC
  Filled 2019-01-31: qty 30

## 2019-01-31 MED ORDER — POLYETHYLENE GLYCOL 3350 17 G PO PACK: 17.0000 g | PACK | Freq: Every day | ORAL | Status: DC | PRN

## 2019-01-31 MED ORDER — ASPIRIN EC 325 MG PO TBEC: 325.0000 mg | DELAYED_RELEASE_TABLET | Freq: Two times a day (BID) | ORAL | 0 refills | Status: DC

## 2019-01-31 MED ORDER — DIPHENHYDRAMINE HCL 12.5 MG/5ML PO ELIX: 12.5000 mg | ORAL_SOLUTION | ORAL | Status: DC | PRN

## 2019-01-31 MED ORDER — METFORMIN HCL ER 500 MG PO TB24
1000.0000 mg | ORAL_TABLET | Freq: Every day | ORAL | Status: DC
  Administered 2019-02-01: 08:00:00 1000 mg via ORAL
  Filled 2019-01-31: qty 2

## 2019-01-31 MED ORDER — TRANEXAMIC ACID-NACL 1000-0.7 MG/100ML-% IV SOLN
1000.0000 mg | INTRAVENOUS | Status: AC
  Administered 2019-01-31: 11:00:00 1000 mg via INTRAVENOUS
  Filled 2019-01-31: qty 100

## 2019-01-31 MED ORDER — PROPOFOL 10 MG/ML IV BOLUS
INTRAVENOUS | Status: AC
  Filled 2019-01-31: qty 60

## 2019-01-31 MED ORDER — ADULT MULTIVITAMIN W/MINERALS CH
1.0000 | ORAL_TABLET | Freq: Every day | ORAL | Status: DC
  Administered 2019-02-01: 1 via ORAL
  Filled 2019-01-31: qty 1

## 2019-01-31 MED ORDER — ACETAMINOPHEN 500 MG PO TABS
1000.0000 mg | ORAL_TABLET | Freq: Four times a day (QID) | ORAL | Status: DC
  Administered 2019-01-31 – 2019-02-01 (×3): 1000 mg via ORAL
  Filled 2019-01-31 (×3): qty 2

## 2019-01-31 MED ORDER — KETOROLAC TROMETHAMINE 30 MG/ML IJ SOLN
INTRAMUSCULAR | Status: AC
  Filled 2019-01-31: qty 1

## 2019-01-31 MED ORDER — METOCLOPRAMIDE HCL 5 MG PO TABS: 5.0000 mg | ORAL_TABLET | Freq: Three times a day (TID) | ORAL | Status: DC | PRN

## 2019-01-31 MED ORDER — ONDANSETRON HCL 4 MG/2ML IJ SOLN
INTRAMUSCULAR | Status: DC | PRN
  Administered 2019-01-31: 4 mg via INTRAVENOUS

## 2019-01-31 MED ORDER — PROPOFOL 10 MG/ML IV BOLUS
INTRAVENOUS | Status: DC | PRN
  Administered 2019-01-31: 10 mg via INTRAVENOUS

## 2019-01-31 MED ORDER — ONDANSETRON HCL 4 MG/2ML IJ SOLN: 4.0000 mg | Freq: Four times a day (QID) | INTRAMUSCULAR | Status: DC | PRN

## 2019-01-31 MED ORDER — FENTANYL CITRATE (PF) 100 MCG/2ML IJ SOLN
INTRAMUSCULAR | Status: DC | PRN
  Administered 2019-01-31: 75 ug via INTRAVENOUS
  Administered 2019-01-31: 100 ug via INTRAVENOUS
  Administered 2019-01-31: 25 ug via INTRAVENOUS

## 2019-01-31 MED ORDER — TRANEXAMIC ACID-NACL 1000-0.7 MG/100ML-% IV SOLN
1000.0000 mg | Freq: Once | INTRAVENOUS | Status: AC
  Administered 2019-01-31: 1000 mg via INTRAVENOUS
  Filled 2019-01-31: qty 100

## 2019-01-31 SURGICAL SUPPLY — 63 items
ACETAB CUP W GRIPTION 54MM (Plate) ×1 IMPLANT
ACETAB CUP W/GRIPTION 54 (Plate) ×2 IMPLANT
BIT DRILL 2.0X128 (BIT) ×2 IMPLANT
BIT DRILL 2.0X128MM (BIT) ×1
BLADE SAW SAG 73X25 THK (BLADE) ×1
BLADE SAW SGTL 73X25 THK (BLADE) ×2 IMPLANT
BLADE SURG SZ10 CARB STEEL (BLADE) ×6 IMPLANT
CLOSURE STERI-STRIP 1/2X4 (GAUZE/BANDAGES/DRESSINGS) ×1
CLSR STERI-STRIP ANTIMIC 1/2X4 (GAUZE/BANDAGES/DRESSINGS) ×3 IMPLANT
COVER SURGICAL LIGHT HANDLE (MISCELLANEOUS) ×3 IMPLANT
COVER WAND RF STERILE (DRAPES) IMPLANT
CUP ACETAB W/GRIPTION 54 (Plate) IMPLANT
DRAPE INCISE IOBAN 66X45 STRL (DRAPES) ×6 IMPLANT
DRAPE ORTHO SPLIT 77X108 STRL (DRAPES) ×6
DRAPE POUCH INSTRU U-SHP 10X18 (DRAPES) ×3 IMPLANT
DRAPE SHEET LG 3/4 BI-LAMINATE (DRAPES) ×3 IMPLANT
DRAPE SURG 17X11 SM STRL (DRAPES) ×3 IMPLANT
DRAPE SURG ORHT 6 SPLT 77X108 (DRAPES) ×2 IMPLANT
DRAPE U-SHAPE 47X51 STRL (DRAPES) ×3 IMPLANT
DRSG MEPILEX BORDER 4X8 (GAUZE/BANDAGES/DRESSINGS) ×3 IMPLANT
DURAPREP 26ML APPLICATOR (WOUND CARE) ×6 IMPLANT
ELECT BLADE TIP CTD 4 INCH (ELECTRODE) ×3 IMPLANT
ELECT REM PT RETURN 15FT ADLT (MISCELLANEOUS) ×3 IMPLANT
ELIMINATOR HOLE APEX DEPUY (Hips) ×2 IMPLANT
GLOVE BIO SURGEON STRL SZ7.5 (GLOVE) ×3 IMPLANT
GLOVE BIO SURGEON STRL SZ8 (GLOVE) ×3 IMPLANT
GLOVE BIOGEL PI IND STRL 8 (GLOVE) ×2 IMPLANT
GLOVE BIOGEL PI INDICATOR 8 (GLOVE) ×4
GOWN STRL REUS W/TWL 2XL LVL3 (GOWN DISPOSABLE) ×3 IMPLANT
GOWN STRL REUS W/TWL LRG LVL3 (GOWN DISPOSABLE) ×3 IMPLANT
HEAD CERAMIC 36 PLUS5 (Hips) ×2 IMPLANT
HOOD PEEL AWAY FLYTE STAYCOOL (MISCELLANEOUS) ×9 IMPLANT
IRRIG SUCT STRYKERFLOW 2 WTIP (MISCELLANEOUS) ×3
IRRIGATION SUCT STRKRFLW 2 WTP (MISCELLANEOUS) IMPLANT
KIT BASIN OR (CUSTOM PROCEDURE TRAY) ×3 IMPLANT
KIT TURNOVER KIT A (KITS) IMPLANT
LINER NEUTRAL 54X36MM PLUS 4 (Hips) ×2 IMPLANT
MANIFOLD NEPTUNE II (INSTRUMENTS) ×3 IMPLANT
NDL SAFETY ECLIPSE 18X1.5 (NEEDLE) IMPLANT
NEEDLE HYPO 18GX1.5 SHARP (NEEDLE) ×3
NEEDLE HYPO 22GX1.5 SAFETY (NEEDLE) ×3 IMPLANT
NS IRRIG 1000ML POUR BTL (IV SOLUTION) ×3 IMPLANT
PACK TOTAL JOINT (CUSTOM PROCEDURE TRAY) ×3 IMPLANT
PROTECTOR NERVE ULNAR (MISCELLANEOUS) ×3 IMPLANT
RETRIEVER SUT HEWSON (MISCELLANEOUS) ×3 IMPLANT
SCREW 6.5MMX25MM (Screw) ×2 IMPLANT
STEM FEM CMT STD SZ3 38 (Stem) ×2 IMPLANT
SUCTION FRAZIER HANDLE 12FR (TUBING) ×2
SUCTION TUBE FRAZIER 12FR DISP (TUBING) ×1 IMPLANT
SUT FIBERWIRE #2 38 REV NDL BL (SUTURE) ×9
SUT MNCRL AB 4-0 PS2 18 (SUTURE) IMPLANT
SUT VIC AB 0 CT1 36 (SUTURE) ×3 IMPLANT
SUT VIC AB 2-0 CT1 27 (SUTURE) ×6
SUT VIC AB 2-0 CT1 TAPERPNT 27 (SUTURE) ×2 IMPLANT
SUT VIC AB 3-0 SH 8-18 (SUTURE) ×3 IMPLANT
SUTURE FIBERWR#2 38 REV NDL BL (SUTURE) ×3 IMPLANT
SYR 3ML LL SCALE MARK (SYRINGE) ×2 IMPLANT
SYR CONTROL 10ML LL (SYRINGE) ×3 IMPLANT
TOWEL OR 17X26 10 PK STRL BLUE (TOWEL DISPOSABLE) ×6 IMPLANT
TOWEL OR NON WOVEN STRL DISP B (DISPOSABLE) ×3 IMPLANT
TRAY FOLEY MTR SLVR 16FR STAT (SET/KITS/TRAYS/PACK) ×3 IMPLANT
WATER STERILE IRR 1000ML POUR (IV SOLUTION) ×6 IMPLANT
YANKAUER SUCT BULB TIP 10FT TU (MISCELLANEOUS) ×3 IMPLANT

## 2019-01-31 NOTE — Discharge Instructions (Signed)

## 2019-01-31 NOTE — Plan of Care (Signed)
Plan of care for post op day 0 discussed with patient and husband.

## 2019-01-31 NOTE — Anesthesia Postprocedure Evaluation (Signed)
Anesthesia Post Note  Patient: SHAWNDREA RUTKOWSKI  Procedure(s) Performed: TOTAL HIP ARTHROPLASTY (Right )     Patient location during evaluation: PACU Anesthesia Type: Spinal Level of consciousness: awake Pain management: pain level controlled Vital Signs Assessment: post-procedure vital signs reviewed and stable Respiratory status: spontaneous breathing Cardiovascular status: stable Postop Assessment: no headache, no backache, spinal receding, patient able to bend at knees and no apparent nausea or vomiting Anesthetic complications: no    Last Vitals:  Vitals:   01/31/19 1315 01/31/19 1330  BP: 131/75 123/75  Pulse: 78 76  Resp: 12 13  Temp:  (!) 36.4 C  SpO2: 98% 100%    Last Pain:  Vitals:   01/31/19 1342  TempSrc:   PainSc: 4    Pain Goal: Patients Stated Pain Goal: 4 (01/31/19 1342)  LLE Motor Response: Purposeful movement, Responds to commands (01/31/19 1330) LLE Sensation: Numbness (01/31/19 1330) RLE Motor Response: Purposeful movement, Responds to commands (01/31/19 1330) RLE Sensation: Numbness (01/31/19 1330) L Sensory Level: L4-Anterior knee, lower leg (01/31/19 1330) R Sensory Level: S1-Sole of foot, small toes (01/31/19 1330) Epidural/Spinal Function Cutaneous sensation: Able to Wiggle Toes (01/31/19 1330), Patient able to flex knees: Yes (01/31/19 1330), Patient able to lift hips off bed: No (01/31/19 1330), Back pain beyond tenderness at insertion site: No (01/31/19 1330), Progressively worsening motor and/or sensory loss: No (01/31/19 1330), Bowel and/or bladder incontinence post epidural: No (01/31/19 1303)  Huston Foley

## 2019-01-31 NOTE — Op Note (Signed)
01/31/2019  12:27 PM  PATIENT:  Kathy Mccarthy   MRN: [id number]  PRE-OPERATIVE DIAGNOSIS: Right hip dysplasia with severe osteoarthritis  POST-OPERATIVE DIAGNOSIS:  same  PROCEDURE:  Procedure(s): TOTAL HIP ARTHROPLASTY  PREOPERATIVE INDICATIONS:    Kathy Mccarthy is an 48 y.o. female who has a diagnosis of right hip osteoarthritis secondary to dysplasia and elected for surgical management after failing conservative treatment.  The risks benefits and alternatives were discussed with the patient including but not limited to the risks of nonoperative treatment, versus surgical intervention including infection, bleeding, nerve injury, periprosthetic fracture, the need for revision surgery, dislocation, leg length discrepancy, blood clots, cardiopulmonary complications, morbidity, mortality, among others, and they were willing to proceed.     OPERATIVE REPORT     SURGEON:  Marchia Bond, MD    ASSISTANT:  Joya Gaskins, OPA-C  (Present throughout the entire procedure,  necessary for completion of procedure in a timely manner, assisting with retraction, instrumentation, and closure)     ANESTHESIA: Spinal  ESTIMATED BLOOD LOSS: 034 mL    COMPLICATIONS:  None.     UNIQUE ASPECTS OF THE CASE: The surgical approach was somewhat challenging, the gluteus musculature extended distal to the tip of the trochanter.  There was a significant amount of adipose covering the external rotators.  Access was initially challenging and required fairly aggressive capsular release in order to gain access to the femoral head.  The head itself was already fairly uncovered.  The acetabulum was fairly shallow, I did ream down to the medial wall, but still had a fair amount of the cup uncovered both superiorly and posteriorly.  I was matching the anterior wall, may be just slightly anteverted.  The acetabular augmentation screw was slightly superior and posterior, could be bicortical or skirting up the posterior  column.  The acetabulum fit nicely, although may have been a slight bit of rim fit, I did ream line to line.  I did not remove very much bone during the reaming process.  The +1 matched her leg length on the contralateral side, but this was somewhat short secondary to soft tissue assessment, and the contralateral side has arthritic changes already as well, the +5 provided better stability, no posterior impingement, and a better restoration of soft tissue tension.  COMPONENTS:  [employer] fit femur size 2 with a 36 mm + 5 metallic head ball and a Gription Acetabular shell size 54, with a single cancellous screw for backup fixation, with an apex hole eliminator and a +4 neutral polyethylene liner.    PROCEDURE IN DETAIL:   The patient was met in the holding area and  identified.  The appropriate hip was identified and marked at the operative site.  The patient was then transported to the OR  and  placed under anesthesia.  At that point, the patient was  placed in the lateral decubitus position with the operative side up and  secured to the operating room table and all bony prominences padded.     The operative lower extremity was prepped from the iliac crest to the distal leg.  Sterile draping was performed.  Time out was performed prior to incision.      A routine posterolateral approach was utilized via sharp dissection  carried down to the subcutaneous tissue.  Gross bleeders were Bovie coagulated.  The iliotibial band was identified and incised along the length of the skin incision.  Self-retaining retractors were  inserted.  With the hip internally  rotated, the short external rotators  were identified. The piriformis and capsule was tagged with FiberWire, and the hip capsule released in a T-type fashion.  The femoral neck was exposed, and I resected the femoral neck using the appropriate jig. This was performed at approximately a thumb's breadth above the lesser trochanter.    I  then exposed the deep acetabulum, cleared out any tissue including the ligamentum teres.  A wing retractor was placed.  After adequate visualization, I excised the labrum, and then sequentially reamed.  I placed the trial acetabulum, which seated nicely, and then impacted the real cup into place.  Appropriate version and inclination was confirmed clinically matching their bony anatomy, and also with the use of the jig.  I placed a cancellous screw to augment fixation.  A trial polyethylene liner was placed and the wing retractor removed.    I then prepared the proximal femur using the cookie-cutter, the lateralizing reamer, and then sequentially reamed and broached.  A trial broach, neck, and head was utilized, and I reduced the hip and it was found to have excellent stability with functional range of motion. The trial components were then removed, and the real polyethylene liner was placed.  I then impacted the real femoral prosthesis into place into the appropriate version, slightly anteverted to the normal anatomy, and I impacted the real head ball into place. The hip was then reduced and taken through functional range of motion and found to have excellent stability. Leg lengths were restored.  I then used a 2 mm drill bits to pass the FiberWire suture from the capsule and piriformis through the greater trochanter, and secured this. Excellent posterior capsular repair was achieved. I also closed the T in the capsule.  I then irrigated the hip copiously again with pulse lavage, and repaired the fascia with Vicryl, followed by Vicryl for the subcutaneous tissue, Monocryl for the skin, Steri-Strips and sterile gauze. The wounds were injected. The patient was then awakened and returned to PACU in stable and satisfactory condition. There were no complications.  Marchia Bond, MD Orthopedic Surgeon [phone]   01/31/2019 12:27 PM

## 2019-01-31 NOTE — Evaluation (Signed)
Physical Therapy Evaluation Patient Details Name: Kathy Mccarthy MRN: [id number] DOB: [birth year] Today's Date: 01/31/2019   History of Present Illness  R posterior approach THA  Clinical Impression  Pt is s/p THA resulting in the deficits listed below (see PT Problem List). Pt ambulated 10' with RW, no loss of balance. Instructed pt/spouse in posterior hip precautions. Good progress expected.  Pt will benefit from skilled PT to increase their independence and safety with mobility to allow discharge to the venue listed below.      Follow Up Recommendations Follow surgeon's recommendation for DC plan and follow-up therapies    Equipment Recommendations  Rolling walker with 5" wheels;3in1 (PT)    Recommendations for Other Services       Precautions / Restrictions Precautions Precautions: Posterior Hip Precaution Booklet Issued: Yes (comment) Precaution Comments: instructed pt/spouse in posterior hip precautions; issued handout, sign hung on door Restrictions Weight Bearing Restrictions: No Other Position/Activity Restrictions: WBAT RLE      Mobility  Bed Mobility Overal bed mobility: Needs Assistance Bed Mobility: Supine to Sit     Supine to sit: HOB elevated;Min assist     General bed mobility comments: min A to raise trunk  Transfers Overall transfer level: Needs assistance Equipment used: Rolling walker (2 wheeled) Transfers: Sit to/from Stand Sit to Stand: From elevated surface;Min assist         General transfer comment: VCs hand placement, min A to rise  Ambulation/Gait Ambulation/Gait assistance: Min guard Gait Distance (Feet): 50 Feet Assistive device: Rolling walker (2 wheeled) Gait Pattern/deviations: Step-to pattern;Decreased step length - left;Decreased step length - right Gait velocity: decr   General Gait Details: VCs sequencing, no loss of balance  Stairs            Wheelchair Mobility    Modified Rankin (Stroke Patients Only)        Balance Overall balance assessment: Modified Independent                                           Pertinent Vitals/Pain Pain Assessment: 0-10 Pain Score: 4  Pain Location: R hip Pain Descriptors / Indicators: Sore Pain Intervention(s): Limited activity within patient's tolerance;Monitored during session;Premedicated before session;Ice applied    Home Living Family/patient expects to be discharged to:: Private residence Living Arrangements: Spouse/significant other Available Help at Discharge: Family;Available 24 hours/day   Home Access: Stairs to enter Entrance Stairs-Rails: Right Entrance Stairs-Number of Steps: 2 Home Layout: Two level;Bed/bath upstairs Home Equipment: None      Prior Function Level of Independence: Independent               Hand Dominance        Extremity/Trunk Assessment   Upper Extremity Assessment Upper Extremity Assessment: Overall WFL for tasks assessed    Lower Extremity Assessment Lower Extremity Assessment: RLE deficits/detail RLE Deficits / Details: knee ext at least 3/5, hip flexion -3/5 RLE Sensation: WNL RLE Coordination: WNL    Cervical / Trunk Assessment Cervical / Trunk Assessment: Normal  Communication   Communication: No difficulties  Cognition Arousal/Alertness: Awake/alert Behavior During Therapy: WFL for tasks assessed/performed Overall Cognitive Status: Within Functional Limits for tasks assessed  General Comments      Exercises Total Joint Exercises Ankle Circles/Pumps: AROM;Both;10 reps;Supine   Assessment/Plan    PT Assessment Patient needs continued PT services  PT Problem List Decreased strength;Decreased activity tolerance;Decreased mobility;Decreased balance       PT Treatment Interventions DME instruction;Gait training;Stair training;Functional mobility training;Therapeutic exercise;Patient/family education    PT Goals  (Current goals can be found in the Care Plan section)  Acute Rehab PT Goals Patient Stated Goal: water aerobics PT Goal Formulation: With patient/family Time For Goal Achievement: 02/07/19 Potential to Achieve Goals: Good    Frequency 7X/week   Barriers to discharge        Co-evaluation               AM-PAC PT "6 Clicks" Mobility  Outcome Measure Help needed turning from your back to your side while in a flat bed without using bedrails?: A Little Help needed moving from lying on your back to sitting on the side of a flat bed without using bedrails?: A Little Help needed moving to and from a bed to a chair (including a wheelchair)?: A Little Help needed standing up from a chair using your arms (e.g., wheelchair or bedside chair)?: A Little Help needed to walk in hospital room?: A Little Help needed climbing 3-5 steps with a railing? : A Lot 6 Click Score: 17    End of Session Equipment Utilized During Treatment: Gait belt Activity Tolerance: Patient tolerated treatment well Patient left: in chair;with call bell/phone within reach;with family/visitor present Nurse Communication: Mobility status PT Visit Diagnosis: Difficulty in walking, not elsewhere classified (R26.2);Pain Pain - Right/Left: Right Pain - part of body: Hip    Time: 1811-1840 PT Time Calculation (min) (ACUTE ONLY): 29 min   Charges:   PT Evaluation $PT Eval Low Complexity: 1 Low PT Treatments $Gait Training: 8-22 mins       Blondell Reveal Kistler PT 01/31/2019  Acute Rehabilitation Services Pager [phone] Office [phone]

## 2019-01-31 NOTE — Anesthesia Procedure Notes (Signed)
Spinal  Patient location during procedure: OR Start time: 01/31/2019 10:25 AM End time: 01/31/2019 10:27 AM Staffing Anesthesiologist: Lyn Hollingshead, MD Performed: anesthesiologist  Preanesthetic Checklist Completed: patient identified, site marked, surgical consent, pre-op evaluation, timeout performed, IV checked, risks and benefits discussed and monitors and equipment checked Spinal Block Patient position: sitting Prep: site prepped and draped and DuraPrep Patient monitoring: continuous pulse ox and blood pressure Approach: midline Location: L3-4 Injection technique: single-shot Needle Needle type: Pencan  Needle gauge: 24 G Needle length: 10 cm Needle insertion depth: 6 cm Assessment Sensory level: T6

## 2019-01-31 NOTE — Transfer of Care (Signed)
Immediate Anesthesia Transfer of Care Note  Patient: Kathy Mccarthy  Procedure(s) Performed: Procedure(s): TOTAL HIP ARTHROPLASTY (Right)  Patient Location: PACU  Anesthesia Type:Spinal  Level of Consciousness:  sedated, patient cooperative and responds to stimulation  Airway & Oxygen Therapy:Patient Spontanous Breathing and Patient connected to face mask oxgen  Post-op Assessment:  Report given to PACU RN and Post -op Vital signs reviewed and stable  Post vital signs:  Reviewed and stable  Last Vitals:  Vitals:   01/31/19 0840  BP: (!) 158/100  Pulse: 86  Resp: 18  Temp: 36.8 C  SpO2: 25%    Complications: No apparent anesthesia complications

## 2019-01-31 NOTE — H&P (Addendum)
PREOPERATIVE H&P  Chief Complaint: Right hip pain  HPI: Kathy Mccarthy is a 48 y.o. female who presents for preoperative history and physical with a diagnosis of right hip osteoarthritis. Symptoms are rated as moderate to severe, and have been worsening.  This is significantly impairing activities of daily living.  She has elected for surgical management.   She has failed injections, activity modification, anti-inflammatories, and assistive devices.  Preoperative X-rays demonstrate end stage degenerative changes with osteophyte formation, loss of joint space, subchondral sclerosis. She has severe dysplasia with end-stage changes.  Past Medical History:  Diagnosis Date  . Allergies    has drainage from seasonal allergies  . Arthritis   . Dermatophytosis of nail   . Diabetes mellitus without complication ([place])    type 2  ,  does not monitor cbgs  at home    . Elevated blood pressure reading without diagnosis of hypertension   . Ganglion of tendon sheath   . Pain in joint, multiple sites   . PCOS (polycystic ovarian syndrome)   . Pure hypercholesterolemia   . Seasonal allergies   . Unspecified disorder of skin and subcutaneous tissue    Past Surgical History:  Procedure Laterality Date  . MASS EXCISION Right 08/09/2018   Procedure: EXCISION MASS RIGHT THUMB NAILBED;  Surgeon: Daryll Brod, MD;  Location: [facility];  Service: Orthopedics;  Laterality: Right;  FAB  . NO PAST SURGERIES    . TONSILLECTOMY  age 65   Social History   Socioeconomic History  . Marital status: Married    Spouse name: Not on file  . Number of children: Not on file  . Years of education: Not on file  . Highest education level: Not on file  Occupational History  . Not on file  Social Needs  . Financial resource strain: Not on file  . Food insecurity    Worry: Not on file    Inability: Not on file  . Transportation needs    Medical: Not on file    Non-medical: Not on file  Tobacco Use   . Smoking status: Never Smoker  . Smokeless tobacco: Never Used  Substance and Sexual Activity  . Alcohol use: No  . Drug use: No  . Sexual activity: Not on file  Lifestyle  . Physical activity    Days per week: Not on file    Minutes per session: Not on file  . Stress: Not on file  Relationships  . Social Herbalist on phone: Not on file    Gets together: Not on file    Attends religious service: Not on file    Active member of club or organization: Not on file    Attends meetings of clubs or organizations: Not on file    Relationship status: Not on file  Other Topics Concern  . Not on file  Social History Narrative   Regular exercise-yes, lives with husband and daughter   Family History  Adopted: Yes  Problem Relation Age of Onset  . Stroke Mother        mother's side  . Deep vein thrombosis Mother        mother's side   No Known Allergies Prior to Admission medications   Medication Sig Start Date End Date Taking? Authorizing Provider  acetaminophen (TYLENOL) 500 MG tablet Take 1,500 mg by mouth daily as needed for moderate pain or headache.   Yes [provider]  atorvastatin (LIPITOR) 20 MG  tablet Take 1 tablet (20 mg total) by mouth daily. 08/19/18  Yes Bedsole, Amy E, MD  cyclobenzaprine (FLEXERIL) 10 MG tablet TAKE 0.5-1 TABLETS (5-10 MG TOTAL) BY MOUTH AT BEDTIME. Patient taking differently: Take 5 mg by mouth at bedtime.  01/10/19  Yes Bedsole, Amy E, MD  diclofenac (VOLTAREN) 75 MG EC tablet Take 1 tablet (75 mg total) by mouth 2 (two) times daily. 01/20/19  Yes Bedsole, Amy E, MD  gabapentin (NEURONTIN) 300 MG capsule TAKE 1 CAPSULE BY MOUTH EVERYDAY AT BEDTIME Patient taking differently: Take 300 mg by mouth at bedtime.  11/21/18  Yes Bedsole, Amy E, MD  GLUCOSAMINE-CHONDROITIN PO Take 2 tablets by mouth daily.   Yes [provider]  hydrochlorothiazide (HYDRODIURIL) 25 MG tablet TAKE 1 TABLET BY MOUTH EVERY DAY WITH LOSARTAN 01/31/19   Yes Bedsole, Amy E, MD  losartan (COZAAR) 100 MG tablet TAKE 1 TABLET BY MOUTH EVERY DAY (TAKE WITH HCTZ) Patient taking differently: Take 100 mg by mouth daily.  08/11/18  Yes Bedsole, Amy E, MD  metFORMIN (GLUCOPHAGE-XR) 500 MG 24 hr tablet TAKE 1 TABLET BY MOUTH EVERY DAY WITH BREAKFAST Patient taking differently: Take 1,000 mg by mouth daily.  01/04/19  Yes Bedsole, Amy E, MD  Multiple Vitamins-Minerals (MULTIVITAMIN WITH MINERALS) tablet Take 1 tablet by mouth daily.   Yes [provider]  triamcinolone (NASACORT ALLERGY 24HR) 55 MCG/ACT AERO nasal inhaler Place 2 sprays into the nose daily.   Yes [provider]  levonorgestrel (MIRENA) 20 MCG/24HR IUD 1 each by Intrauterine route once. 01/24/15   [provider]     Positive ROS: All other systems have been reviewed and were otherwise negative with the exception of those mentioned in the HPI and as above.  Physical Exam: General: Alert, no acute distress Cardiovascular: No pedal edema Respiratory: No cyanosis, no use of accessory musculature GI: No organomegaly, abdomen is soft and non-tender Skin: No lesions in the area of chief complaint Neurologic: Sensation intact distally Psychiatric: Patient is competent for consent with normal mood and affect Lymphatic: No axillary or cervical lymphadenopathy  MUSCULOSKELETAL: Right hip has range of motion 0 to 80 degrees at best.  Extreme limitation with internal and external rotation secondary to pain.  Assessment: Severe bilateral hip dysplasia   Plan: Plan for Procedure(s): TOTAL HIP ARTHROPLASTY  The risks benefits and alternatives were discussed with the patient including but not limited to the risks of nonoperative treatment, versus surgical intervention including infection, bleeding, nerve injury, periprosthetic fracture, the need for revision surgery, dislocation, leg length discrepancy, blood clots, cardiopulmonary complications, morbidity, mortality,  among others, and they were willing to proceed.     Anticipated LOS equal to or greater than 2 midnights due to - Age 50 and older with one or more of the following:  - Obesity  - Expected need for hospital services (PT, OT, Nursing) required for safe  discharge  - Anticipated need for postoperative skilled nursing care or inpatient rehab  - Active co-morbidities: Diabetes OR   - Unanticipated findings during/Post Surgery: None  - Patient is a high risk of re-admission due to: None     Johnny Bridge, MD Cell [phone]   01/31/2019 9:33 AM

## 2019-02-01 LAB — BASIC METABOLIC PANEL
Anion gap: 8 (ref 5–15)
BUN: 19 mg/dL (ref 6–20)
CO2: 25 mmol/L (ref 22–32)
Calcium: 8.6 mg/dL — ABNORMAL LOW (ref 8.9–10.3)
Chloride: 103 mmol/L (ref 98–111)
Creatinine, Ser: 0.78 mg/dL (ref 0.44–1.00)
GFR calc Af Amer: >60 mL/min (ref >60)
GFR calc non Af Amer: >60 mL/min (ref >60)
Glucose, Bld: 142 mg/dL — ABNORMAL HIGH (ref 70–99)
Potassium: 4.4 mmol/L (ref 3.5–5.1)
Sodium: 136 mmol/L (ref 135–145)

## 2019-02-01 LAB — CBC
HCT: 32.1 % — ABNORMAL LOW (ref 36.0–46.0)
Hemoglobin: 10.6 g/dL — ABNORMAL LOW (ref 12.0–15.0)
MCH: 31.7 pg (ref 26.0–34.0)
MCHC: 33 g/dL (ref 30.0–36.0)
MCV: 96.1 fL (ref 80.0–100.0)
Platelets: 168 10*3/uL (ref 150–400)
RBC: 3.34 MIL/uL — ABNORMAL LOW (ref 3.87–5.11)
RDW: 12.4 % (ref 11.5–15.5)
WBC: 8.7 10*3/uL (ref 4.0–10.5)
nRBC: 0 % (ref 0.0–0.2)

## 2019-02-01 LAB — GLUCOSE, CAPILLARY: Glucose-Capillary: 131 mg/dL — ABNORMAL HIGH (ref 70–99)

## 2019-02-01 NOTE — Progress Notes (Addendum)
Patient doing well, wants to go home.   NVI, dressing clean.   Plan dc home today.   Johnny Bridge, MD

## 2019-02-01 NOTE — Progress Notes (Signed)
Physical Therapy Treatment Patient Details Name: Kathy Mccarthy MRN: [id number] DOB: August 06, 1970 Today's Date: 02/01/2019    History of Present Illness R posterior approach THA    PT Comments    Pt ambulated 300' with RW, no loss of balance. Completed stair training and instruction in [place]. Reviewed posterior hip precautions in detail, pt demonstrates good understanding of them. She has met PT goals and is ready to DC home from PT standpoint.    Follow Up Recommendations  Follow surgeon's recommendation for DC plan and follow-up therapies     Equipment Recommendations  Rolling walker with 5" wheels;3in1 (PT)    Recommendations for Other Services       Precautions / Restrictions Precautions Precautions: Posterior Hip Precaution Booklet Issued: Yes (comment) Precaution Comments: instructed pt/spouse in posterior hip precautions; issued handout, sign hung on door Restrictions Weight Bearing Restrictions: No Other Position/Activity Restrictions: WBAT RLE    Mobility  Bed Mobility Overal bed mobility: Modified Independent Bed Mobility: Supine to Sit     Supine to sit: HOB elevated;Modified independent (Device/Increase time)        Transfers Overall transfer level: Needs assistance Equipment used: Rolling walker (2 wheeled) Transfers: Sit to/from Stand Sit to Stand: From elevated surface;Supervision         General transfer comment: VCs hand placement  Ambulation/Gait Ambulation/Gait assistance: Modified independent (Device/Increase time) Gait Distance (Feet): 300 Feet Assistive device: Rolling walker (2 wheeled) Gait Pattern/deviations: Step-to pattern;Decreased step length - left;Decreased step length - right Gait velocity: decr   General Gait Details: VCs sequencing, no loss of balance   Stairs Stairs: Yes   Stair Management: One rail Right;Forwards;With cane;Step to pattern Number of Stairs: 3 General stair comments: VCs sequencing   Wheelchair  Mobility    Modified Rankin (Stroke Patients Only)       Balance Overall balance assessment: Modified Independent                                          Cognition Arousal/Alertness: Awake/alert Behavior During Therapy: WFL for tasks assessed/performed Overall Cognitive Status: Within Functional Limits for tasks assessed                                        Exercises Total Joint Exercises Ankle Circles/Pumps: AROM;Both;10 reps;Supine Quad Sets: AROM;Right;5 reps;Supine Short Arc Quad: AROM;Right;10 reps;Supine Heel Slides: AAROM;Right;10 reps;Supine Hip ABduction/ADduction: AROM;Right;10 reps;Standing;Supine Long Arc Quad: AROM;Right;10 reps;Seated Knee Flexion: AROM;Right;5 reps;Standing Marching in Standing: AROM;Right;Standing;5 reps Standing Hip Extension: AROM;Right;5 reps;Standing    General Comments        Pertinent Vitals/Pain Pain Score: 4  Pain Location: R hip Pain Descriptors / Indicators: Sore Pain Intervention(s): Limited activity within patient's tolerance;Monitored during session;Premedicated before session;Ice applied    Home Living                      Prior Function            PT Goals (current goals can now be found in the care plan section) Acute Rehab PT Goals Patient Stated Goal: water aerobics PT Goal Formulation: With patient/family Time For Goal Achievement: 02/07/19 Potential to Achieve Goals: Good Progress towards PT goals: Goals met/education completed, patient discharged from PT    Frequency    7X/week  PT Plan Current plan remains appropriate    Co-evaluation              AM-PAC PT "6 Clicks" Mobility   Outcome Measure  Help needed turning from your back to your side while in a flat bed without using bedrails?: None Help needed moving from lying on your back to sitting on the side of a flat bed without using bedrails?: None Help needed moving to and from a bed  to a chair (including a wheelchair)?: None Help needed standing up from a chair using your arms (e.g., wheelchair or bedside chair)?: None Help needed to walk in hospital room?: None Help needed climbing 3-5 steps with a railing? : A Little 6 Click Score: 23    End of Session Equipment Utilized During Treatment: Gait belt Activity Tolerance: Patient tolerated treatment well Patient left: in chair;with call bell/phone within reach Nurse Communication: Mobility status PT Visit Diagnosis: Difficulty in walking, not elsewhere classified (R26.2);Pain Pain - Right/Left: Right Pain - part of body: Hip     Time: 9675-9163 PT Time Calculation (min) (ACUTE ONLY): 31 min  Charges:  $Gait Training: 8-22 mins $Therapeutic Exercise: 8-22 mins                     Kathy Mccarthy PT 02/01/2019  Acute Rehabilitation Services Pager [phone] Office [phone]

## 2019-02-01 NOTE — TOC Progression Note (Signed)
Transition of Care [facility]) - Progression Note    Patient Details  Name: Kathy Mccarthy MRN: [id number] Date of Birth: [birth year]  Transition of Care [facility]) CM/SW [place], LCSW Phone Number: 02/01/2019, 11:43 AM  Clinical Narrative:    RW and 3 in 1 ordered through [place].  DME will be delivered to the patient room.     Barriers to Discharge: No Barriers Identified  Expected Discharge Plan and Services  Home          Expected Discharge Date: 02/01/19               DME Arranged: 3-N-1, Walker rolling DME Agency: [employer] Date DME Agency Contacted: 02/01/19 Time DME Agency Contacted: [phone] Representative spoke with at DME Agency: Jeneen Rinks           Social Determinants of Health ([place]) Interventions    Readmission Risk Interventions No flowsheet data found.

## 2019-02-01 NOTE — Discharge Summary (Addendum)
Physician Discharge Summary  Patient ID: Kathy Mccarthy MRN: [id number] DOB/AGE: March 23, 1971 48 y.o.  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 Allergies    has drainage from seasonal allergies  . Arthritis   . Dermatophytosis of nail   . Diabetes mellitus without complication ([place])    type 2  ,  does not monitor cbgs  at home    . Elevated blood pressure reading without diagnosis of hypertension   . Ganglion of tendon sheath   . 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 Pain in joint, multiple sites   . PCOS (polycystic ovarian syndrome)   . Pure hypercholesterolemia   . Seasonal allergies   . Unspecified disorder of skin and subcutaneous tissue     Surgeries: Procedure(s): TOTAL HIP ARTHROPLASTY on 01/31/2019   Consultants (if any):   Discharged Condition: Improved  Hospital Course: Kathy Mccarthy is an 48 y.o. 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    She was given perioperative antibiotics:  Anti-infectives (From admission, onward)   Start     Dose/Rate Route Frequency Ordered Stop   01/31/19 1800  ceFAZolin (ANCEF) IVPB 2g/100 mL premix     2 g 200 mL/hr over 30 Minutes Intravenous Every 6 hours 01/31/19 1635 02/01/19 0006   01/31/19 0600  ceFAZolin (ANCEF) 3 g in dextrose 5 % 50 mL IVPB     3 g 100 mL/hr over 30 Minutes Intravenous On call to O.R. 01/30/19 0945 01/31/19 1025    .  She was given sequential compression devices, early ambulation, aspirin  for DVT prophylaxis.  Patient NVI POD 1.   She benefited maximally from the hospital stay and there were no complications.     Recent vital signs:  Vitals:   02/01/19 0156 02/01/19 0610  BP: 123/75 128/83  Pulse: 82 73  Resp: 14 18  Temp: 97.7 F (36.5 C) 98 F (36.7 C)  SpO2: 97% 99%    Recent laboratory studies:  Lab Results  Component Value Date   HGB 10.6 (L) 02/01/2019   HGB 13.5 01/24/2019   HGB 14.8 06/08/2013   Lab Results  Component Value Date   WBC 8.7 02/01/2019   PLT 168 02/01/2019   No results found for: INR Lab Results  Component Value Date   NA 136 02/01/2019   K 4.4 02/01/2019   CL 103 02/01/2019   CO2 25 02/01/2019   BUN 19 02/01/2019   CREATININE 0.78 02/01/2019   GLUCOSE 142 (H) 02/01/2019    Discharge Medications:   Allergies as of 02/01/2019   No Known Allergies     Medication List    TAKE these medications   acetaminophen 500 MG tablet Commonly known as: TYLENOL Take 1,500 mg by mouth daily as needed for moderate pain or headache.   aspirin EC 325 MG tablet Take 1 tablet (325 mg total) by mouth 2 (two) times daily.   atorvastatin 20 MG tablet Commonly known as: LIPITOR Take 1 tablet (20 mg total) by mouth daily.   cyclobenzaprine 10 MG tablet Commonly known as: FLEXERIL Take 0.5 tablets (5 mg total) by mouth 3 (three) times daily as needed. What changed:  how much to take  when to take this  reasons to take this   diclofenac 75 MG EC tablet Commonly known as: VOLTAREN Take 1 tablet (75 mg total) by mouth 2 (two) times daily.   gabapentin 300 MG capsule Commonly known as: NEURONTIN TAKE 1 CAPSULE BY MOUTH EVERYDAY AT BEDTIME What changed: See the new instructions.   GLUCOSAMINE-CHONDROITIN PO Take 2 tablets by mouth daily.   hydrochlorothiazide 25 MG tablet Commonly known as: HYDRODIURIL TAKE 1 TABLET BY MOUTH EVERY DAY WITH LOSARTAN   levonorgestrel 20 MCG/24HR IUD Commonly known as: MIRENA 1 each by Intrauterine route once.   losartan 100 MG tablet Commonly known as: COZAAR TAKE 1 TABLET BY MOUTH EVERY DAY (TAKE WITH  HCTZ) What changed: See the new instructions.   metFORMIN 500 MG 24 hr tablet Commonly known as: GLUCOPHAGE-XR TAKE 1 TABLET BY MOUTH EVERY DAY WITH BREAKFAST What changed: See the new instructions.   multivitamin with minerals tablet Take 1 tablet by mouth daily.   Nasacort Allergy 24HR 55 MCG/ACT Aero nasal inhaler Generic drug: triamcinolone Place 2 sprays into the nose daily.   ondansetron 4 MG tablet Commonly known as: Zofran Take 1 tablet (4 mg total) by mouth every 8 (eight) hours as needed for nausea or vomiting.   oxyCODONE 5 MG immediate release tablet Commonly known as: Roxicodone Take 1 tablet (5 mg total) by mouth every 4 (four) hours as needed for severe pain.   sennosides-docusate sodium 8.6-50 MG tablet Commonly known as: SENOKOT-S Take 2 tablets by mouth daily.       Diagnostic Studies: Dg Pelvis Portable  Result Date: 01/31/2019 CLINICAL DATA:  Status post right hip replacement EXAM: PORTABLE PELVIS 1-2 VIEWS COMPARISON:  11/16/2017 FINDINGS: Right hip prosthesis is noted. Progressive degenerative changes are noted in the left hip. IUD is noted in place. The pelvic ring is intact. IMPRESSION: Status post right hip prosthesis. 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 Electronically Signed   By: Inez Catalina M.D.   On: 01/31/2019 15:31   Dg Hip Port Unilat With Pelvis 1v Right  Result Date: 01/31/2019 CLINICAL DATA:  Status post right hip replacement EXAM: DG HIP (WITH OR WITHOUT PELVIS) 1V PORT RIGHT COMPARISON:  11/16/2017 FINDINGS: Right hip prosthesis is noted in satisfactory position. No acute fracture or dislocation is seen. IUD is noted in place. IMPRESSION: Right hip prosthesis in satisfactory position. Electronically Signed   By: Inez Catalina M.D.   On: 01/31/2019 15:25    Disposition:     Follow-up Information    Marchia Bond, MD. Schedule an appointment as soon as possible for a visit in 2 weeks.   Specialty: Orthopedic  Surgery Contact information: [address] [place] [address] [phone]            Signed: Johnny Bridge 02/01/2019, 7:41 AM

## 2019-02-01 NOTE — Plan of Care (Signed)
Plan of care reviewed and discussed with the patient. 

## 2019-02-02 ENCOUNTER — Other Ambulatory Visit: Payer: Self-pay | Admitting: Family Medicine

## 2019-02-07 ENCOUNTER — Encounter (HOSPITAL_COMMUNITY): Payer: Self-pay | Admitting: Orthopedic Surgery

## 2019-02-07 ENCOUNTER — Telehealth: Payer: Self-pay | Admitting: Family Medicine

## 2019-02-07 NOTE — Telephone Encounter (Signed)
Left message for Kathy Mccarthy that I will need her last eye exam office note before I can update her chart.  I ask that she call her eye doctor and ask them to fax  a copy of her exam to [phone].

## 2019-02-07 NOTE — Telephone Encounter (Signed)
Patient stated that she had recently had surgery and when looking at her records she stated that it shows that she is still needing a diabetic eye exam. Patient stated that she had this done about 2/3 months ago and would like to know if this could be updated.  Patient requested a call back if she is needing to obtain records from her eye doctor for [place] C/B # [phone]

## 2019-02-13 ENCOUNTER — Encounter: Payer: Self-pay | Admitting: Family Medicine

## 2019-02-17 ENCOUNTER — Ambulatory Visit: Payer: Managed Care, Other (non HMO) | Admitting: Family Medicine

## 2019-02-22 ENCOUNTER — Other Ambulatory Visit: Payer: Self-pay | Admitting: Family Medicine

## 2019-02-22 NOTE — Telephone Encounter (Signed)
Last office visit 11/22/2018 for Pre-Op.  Last refilled 11/21/2018 for #90 with no refills.  Next Appt: 03/24/2019 for 4 month follow up.

## 2019-02-28 ENCOUNTER — Other Ambulatory Visit: Payer: Self-pay | Admitting: Orthopedic Surgery

## 2019-03-15 ENCOUNTER — Telehealth: Payer: Self-pay | Admitting: Family Medicine

## 2019-03-15 DIAGNOSIS — E119 Type 2 diabetes mellitus without complications: Secondary | ICD-10-CM

## 2019-03-15 NOTE — Telephone Encounter (Signed)
-----   Message from [place] Sia sent at 03/07/2019  3:06 PM EDT ----- Regarding: Lab orders for Friday, 9.25.20 Lab orders for f/u

## 2019-03-16 NOTE — Patient Instructions (Addendum)
DUE TO COVID-19 ONLY ONE VISITOR IS ALLOWED TO COME WITH YOU AND STAY IN THE WAITING ROOM ONLY DURING PRE OP AND PROCEDURE DAY OF SURGERY. THE 1 VISITOR MAY VISIT WITH YOU AFTER SURGERY IN YOUR PRIVATE ROOM DURING VISITING HOURS ONLY!  YOU NEED TO HAVE A COVID 19 TEST ON_Friday 10/02/2020______ @___  0815 am____, THIS TEST MUST BE DONE BEFORE SURGERY, COME  [address] [place] , [address].  ([place]) ONCE YOUR COVID TEST IS COMPLETED, PLEASE BEGIN THE QUARANTINE INSTRUCTIONS AS OUTLINED IN YOUR HANDOUT.                MONETTE CHAMU    Your procedure is scheduled on: Tuesday 03/28/2019   Report to [facility] Main  Entrance               Report to admitting at 7:30am    Call this number if you have problems the morning of surgery [phone]   How to Manage Your Diabetes Before and After Surgery  Why is it important to control my blood sugar before and after surgery? . Improving blood sugar levels before and after surgery helps healing and can limit problems. . A way of improving blood sugar control is eating a healthy diet by: o  Eating less sugar and carbohydrates o  Increasing activity/exercise o  Talking with your doctor about reaching your blood sugar goals . High blood sugars (greater than 180 mg/dL) can raise your risk of infections and slow your recovery, so you will need to focus on controlling your diabetes during the weeks before surgery. . Make sure that the doctor who takes care of your diabetes knows about your planned surgery including the date and location.  How do I manage my blood sugar before surgery? . Check your blood sugar at least 4 times a day, starting 2 days before surgery, to make sure that the level is not too high or low. o Check your blood sugar the morning of your surgery when you wake up and every 2 hours until you get to the Short Stay unit. . If your blood sugar is less than 70 mg/dL, you will need to treat for low  blood sugar: o Do not take insulin. o Treat a low blood sugar (less than 70 mg/dL) with  cup of clear juice (cranberry or apple), 4 glucose tablets, OR glucose gel. o Recheck blood sugar in 15 minutes after treatment (to make sure it is greater than 70 mg/dL). If your blood sugar is not greater than 70 mg/dL on recheck, call [phone] for further instructions. . Report your blood sugar to the short stay nurse when you get to Short Stay.  . If you are admitted to the hospital after surgery: o Your blood sugar will be checked by the staff and you will probably be given insulin after surgery (instead of oral diabetes medicines) to make sure you have good blood sugar levels. o The goal for blood sugar control after surgery is 80-180 mg/dL.   WHAT DO I DO ABOUT MY DIABETES MEDICATION?  The Day before surgery take Metformin as usual.   . Do not take oral diabetes medicines (pills) the morning of surgery.(Metformin)   Remember: Do not eat food After Midnight.    NO SOLID FOOD AFTER MIDNIGHT THE NIGHT PRIOR TO SURGERY. NOTHING BY MOUTH EXCEPT CLEAR LIQUIDS UNTIL 7:00am.    PLEASE FINISH  Gatorade G 2  DRINK PER SURGEON ORDER  WHICH NEEDS  TO BE COMPLETED AT 7:00am.  BRUSH YOUR TEETH MORNING OF SURGERY AND RINSE YOUR MOUTH OUT, NO CHEWING GUM CANDY OR MINTS.  CLEAR LIQUID DIET   Foods Allowed                                                                     Foods Excluded  Coffee and tea, regular and decaf                             liquids that you cannot  Plain Jell-O any favor except red or purple            see through such as: Fruit ices (not with fruit pulp)                                     milk, soups, orange juice  Iced Popsicles                                    All solid food Carbonated beverages, regular and diet                                    Cranberry, grape and apple juices Sports drinks like Gatorade Lightly seasoned clear broth or consume(fat free)Sugar,  honey syrup  Sample Menu Breakfast                                Lunch                                     Supper Cranberry juice                    Beef broth                            Chicken broth Jell-O                                     Grape juice                           Apple juice Coffee or tea                        Jell-O                                      Popsicle  Coffee or tea                        Coffee or tea  _____________________________________________________________________       Take these medicines the morning of surgery with A SIP OF WATER: [place]              DO NOT TAKE ANY DIABETIC MEDICATIONS DAY OF YOUR SURGERY!                               You may not have any metal on your body including hair pins and              piercings  Do not wear jewelry, make-up, lotions, powders or perfumes, deodorant             Do not wear nail polish on your fingernails.  Do not shave  48 hours prior to surgery.                Do not bring valuables to the hospital. [place].  Contacts, dentures or bridgework may not be worn into surgery.  Leave suitcase in the car. After surgery it may be brought to your room.                  Please read over the following fact sheets you were given: _____________________________________________________________________             [facility] - Preparing for Surgery Before surgery, you can play an important role.  Because skin is not sterile, your skin needs to be as free of germs as possible.  You can reduce the number of germs on your skin by washing with CHG (chlorahexidine gluconate) soap before surgery.  CHG is an antiseptic cleaner which kills germs and bonds with the skin to continue killing germs even after washing. Please DO NOT use if you have an allergy to CHG or antibacterial soaps.  If your skin becomes  reddened/irritated stop using the CHG and inform your nurse when you arrive at Short Stay. Do not shave (including legs and underarms) for at least 48 hours prior to the first CHG shower.  You may shave your face/neck. Please follow these instructions carefully:  1.  Shower with CHG Soap the night before surgery and the  morning of Surgery.  2.  If you choose to wash your hair, wash your hair first as usual with your  normal  shampoo.  3.  After you shampoo, rinse your hair and body thoroughly to remove the  shampoo.                           4.  Use CHG as you would any other liquid soap.  You can apply chg directly  to the skin and wash                       Gently with a scrungie or clean washcloth.  5.  Apply the CHG Soap to your body ONLY FROM THE NECK DOWN.   Do not use on face/ open                           Wound  or open sores. Avoid contact with eyes, ears mouth and genitals (private parts).                       Wash face,  Genitals (private parts) with your normal soap.             6.  Wash thoroughly, paying special attention to the area where your surgery  will be performed.  7.  Thoroughly rinse your body with warm water from the neck down.  8.  DO NOT shower/wash with your normal soap after using and rinsing off  the CHG Soap.                9.  Pat yourself dry with a clean towel.            10.  Wear clean pajamas.            11.  Place clean sheets on your bed the night of your first shower and do not  sleep with pets. Day of Surgery : Do not apply any lotions/deodorants the morning of surgery.  Please wear clean clothes to the hospital/surgery center.  FAILURE TO FOLLOW THESE INSTRUCTIONS MAY RESULT IN THE CANCELLATION OF YOUR SURGERY PATIENT SIGNATURE_________________________________  NURSE SIGNATURE__________________________________  ________________________________________________________________________   Adam Phenix  An incentive spirometer is a tool that  can help keep your lungs clear and active. This tool measures how well you are filling your lungs with each breath. Taking long deep breaths may help reverse or decrease the chance of developing breathing (pulmonary) problems (especially infection) following:  A long period of time when you are unable to move or be active. BEFORE THE PROCEDURE   If the spirometer includes an indicator to show your best effort, your nurse or respiratory therapist will set it to a desired goal.  If possible, sit up straight or lean slightly forward. Try not to slouch.  Hold the incentive spirometer in an upright position. INSTRUCTIONS FOR USE  1. Sit on the edge of your bed if possible, or sit up as far as you can in bed or on a chair. 2. Hold the incentive spirometer in an upright position. 3. Breathe out normally. 4. Place the mouthpiece in your mouth and seal your lips tightly around it. 5. Breathe in slowly and as deeply as possible, raising the piston or the ball toward the top of the column. 6. Hold your breath for 3-5 seconds or for as long as possible. Allow the piston or ball to fall to the bottom of the column. 7. Remove the mouthpiece from your mouth and breathe out normally. 8. Rest for a few seconds and repeat Steps 1 through 7 at least 10 times every 1-2 hours when you are awake. Take your time and take a few normal breaths between deep breaths. 9. The spirometer may include an indicator to show your best effort. Use the indicator as a goal to work toward during each repetition. 10. After each set of 10 deep breaths, practice coughing to be sure your lungs are clear. If you have an incision (the cut made at the time of surgery), support your incision when coughing by placing a pillow or rolled up towels firmly against it. Once you are able to get out of bed, walk around indoors and cough well. You may stop using the incentive spirometer when instructed by your caregiver.  RISKS AND  COMPLICATIONS  Take your time so you do not get  dizzy or light-headed.  If you are in pain, you may need to take or ask for pain medication before doing incentive spirometry. It is harder to take a deep breath if you are having pain. AFTER USE  Rest and breathe slowly and easily.  It can be helpful to keep track of a log of your progress. Your caregiver can provide you with a simple table to help with this. If you are using the spirometer at home, follow these instructions: [place] IF:   You are having difficultly using the spirometer.  You have trouble using the spirometer as often as instructed.  Your pain medication is not giving enough relief while using the spirometer.  You develop fever of 100.5 F (38.1 C) or higher. SEEK IMMEDIATE MEDICAL CARE IF:   You cough up bloody sputum that had not been present before.  You develop fever of 102 F (38.9 C) or greater.  You develop worsening pain at or near the incision site. MAKE SURE YOU:   Understand these instructions.  Will watch your condition.  Will get help right away if you are not doing well or get worse. Document Released: 10/19/2006 Document Revised: 08/31/2011 Document Reviewed: 12/20/2006 [facility] Patient Information 2014 [place], [place].   ________________________________________________________________________

## 2019-03-17 ENCOUNTER — Other Ambulatory Visit: Payer: Self-pay

## 2019-03-17 ENCOUNTER — Other Ambulatory Visit (INDEPENDENT_AMBULATORY_CARE_PROVIDER_SITE_OTHER): Payer: Managed Care, Other (non HMO)

## 2019-03-17 ENCOUNTER — Other Ambulatory Visit: Payer: Self-pay | Admitting: Family Medicine

## 2019-03-17 DIAGNOSIS — E119 Type 2 diabetes mellitus without complications: Secondary | ICD-10-CM | POA: Diagnosis not present

## 2019-03-17 LAB — HEMOGLOBIN A1C: Hgb A1c MFr Bld: 6.1 % (ref 4.6–6.5)

## 2019-03-17 NOTE — Telephone Encounter (Signed)
Last office visit 11/22/2018 for Pre-Op.  Last refilled 01/31/2019 for #40 with no refills by Dr. Mardelle Matte.  Next Appt: 03/24/2019 for 4 month follow up.

## 2019-03-17 NOTE — Progress Notes (Signed)
No critical labs need to be addressed urgently. We will discuss labs in detail at upcoming office visit.   

## 2019-03-20 ENCOUNTER — Encounter (HOSPITAL_COMMUNITY)
Admission: RE | Admit: 2019-03-20 | Discharge: 2019-03-20 | Disposition: A | Discharge status: Home / Self Care | Payer: Managed Care, Other (non HMO) | Source: Ambulatory Visit | Attending: Orthopedic Surgery | Admitting: Orthopedic Surgery

## 2019-03-20 ENCOUNTER — Encounter (HOSPITAL_COMMUNITY): Payer: Self-pay

## 2019-03-20 ENCOUNTER — Other Ambulatory Visit: Payer: Self-pay

## 2019-03-20 DIAGNOSIS — Z01818 Encounter for other preprocedural examination: Secondary | ICD-10-CM | POA: Diagnosis not present

## 2019-03-20 DIAGNOSIS — M1612 Unilateral primary osteoarthritis, left hip: Secondary | ICD-10-CM | POA: Insufficient documentation

## 2019-03-20 HISTORY — DX: Essential (primary) hypertension: I10

## 2019-03-20 LAB — CBC
HCT: 42.3 % (ref 36.0–46.0)
Hemoglobin: 13.8 g/dL (ref 12.0–15.0)
MCH: 31 pg (ref 26.0–34.0)
MCHC: 32.6 g/dL (ref 30.0–36.0)
MCV: 95.1 fL (ref 80.0–100.0)
Platelets: 226 10*3/uL (ref 150–400)
RBC: 4.45 MIL/uL (ref 3.87–5.11)
RDW: 12.6 % (ref 11.5–15.5)
WBC: 6.5 10*3/uL (ref 4.0–10.5)
nRBC: 0 % (ref 0.0–0.2)

## 2019-03-20 LAB — BASIC METABOLIC PANEL
Anion gap: 14 (ref 5–15)
BUN: 16 mg/dL (ref 6–20)
CO2: 21 mmol/L — ABNORMAL LOW (ref 22–32)
Calcium: 9.4 mg/dL (ref 8.9–10.3)
Chloride: 104 mmol/L (ref 98–111)
Creatinine, Ser: 0.63 mg/dL (ref 0.44–1.00)
GFR calc Af Amer: >60 mL/min (ref >60)
GFR calc non Af Amer: >60 mL/min (ref >60)
Glucose, Bld: 151 mg/dL — ABNORMAL HIGH (ref 70–99)
Potassium: 3.6 mmol/L (ref 3.5–5.1)
Sodium: 139 mmol/L (ref 135–145)

## 2019-03-20 LAB — GLUCOSE, CAPILLARY: Glucose-Capillary: 139 mg/dL — ABNORMAL HIGH (ref 70–99)

## 2019-03-20 LAB — HEMOGLOBIN A1C
Hgb A1c MFr Bld: 5.9 % — ABNORMAL HIGH (ref 4.8–5.6)
Mean Plasma Glucose: 122.63 mg/dL

## 2019-03-20 LAB — SURGICAL PCR SCREEN
MRSA, PCR: NEGATIVE
Staphylococcus aureus: POSITIVE — AB

## 2019-03-20 NOTE — Progress Notes (Addendum)
PCP - Dr. Algie Coffer Cardiologist - None  Chest x-ray -None  EKG - 08/05/2018 EPIC Stress Test - None ECHO - None Cardiac Cath - None  Sleep Study - None CPAP -   Fasting Blood Sugar - does not know as does not check blood sugars at all Checks Blood Sugar __0___ times a day  Blood Thinner Instructions:None Aspirin Instructions:None Last Dose:  Anesthesia review:   Patient denies shortness of breath, fever, cough and chest pain at PAT appointment   Patient verbalized understanding of instructions that were given to them at the PAT appointment. Patient was also instructed that they will need to review over the PAT instructions again at home before surgery.

## 2019-03-24 ENCOUNTER — Other Ambulatory Visit (HOSPITAL_COMMUNITY)
Admission: RE | Admit: 2019-03-24 | Discharge: 2019-03-24 | Disposition: A | Discharge status: Home / Self Care | Payer: Managed Care, Other (non HMO) | Source: Ambulatory Visit | Attending: Orthopedic Surgery | Admitting: Orthopedic Surgery

## 2019-03-24 ENCOUNTER — Encounter: Payer: Self-pay | Admitting: Family Medicine

## 2019-03-24 ENCOUNTER — Ambulatory Visit: Payer: Managed Care, Other (non HMO) | Admitting: Family Medicine

## 2019-03-24 ENCOUNTER — Ambulatory Visit (INDEPENDENT_AMBULATORY_CARE_PROVIDER_SITE_OTHER): Payer: Managed Care, Other (non HMO) | Admitting: Family Medicine

## 2019-03-24 VITALS — Temp 97.9°F | Ht 69.0 in | Wt 270.0 lb

## 2019-03-24 DIAGNOSIS — Z01812 Encounter for preprocedural laboratory examination: Secondary | ICD-10-CM | POA: Insufficient documentation

## 2019-03-24 DIAGNOSIS — E119 Type 2 diabetes mellitus without complications: Secondary | ICD-10-CM | POA: Diagnosis not present

## 2019-03-24 DIAGNOSIS — Z20828 Contact with and (suspected) exposure to other viral communicable diseases: Secondary | ICD-10-CM | POA: Insufficient documentation

## 2019-03-24 DIAGNOSIS — I10 Essential (primary) hypertension: Secondary | ICD-10-CM

## 2019-03-24 NOTE — Progress Notes (Signed)
VIRTUAL VISIT Due to national recommendations of social distancing due to [place] 19, a virtual visit is felt to be most appropriate for this patient at this time.   I connected with the patient on 03/24/19 at 11:40 AM EDT by virtual telehealth platform and verified that I am speaking with the correct person using two identifiers.   I discussed the limitations, risks, security and privacy concerns of performing an evaluation and management service by  virtual telehealth platform and the availability of in person appointments. I also discussed with the patient that there may be a patient responsible charge related to this service. The patient expressed understanding and agreed to proceed.  Patient location: Home Provider Location: [facility] Participants: Eliezer Lofts and Everardo Pacific   Chief Complaint  Patient presents with  . Diabetes    History of Present Illness:  48 year old female presents for follow up DM  She has upcoming THR on 03/28/2019 Diabetes:   Now excellent control on metformin and with diet control. Lab Results  Component Value Date   HGBA1C 5.9 (H) 03/20/2019  Using medications without difficulties: Hypoglycemic episodes: no symptoms of lows Hyperglycemic episodes: Feet problems: no ulcers Blood Sugars averaging: not checking eye exam within last year: 11/2018  She has been working on healthy diet and limited exercise with hip issues.   She has lost inches.  BPs at home running 129/84.  COVID 19 screen No recent travel or known exposure to COVID19 The patient denies respiratory symptoms of COVID 19 at this time.  The importance of social distancing was discussed today.   Review of Systems  Constitutional: Negative for chills and fever.  HENT: Negative for congestion and ear pain.   Eyes: Negative for pain and redness.  Respiratory: Negative for cough and shortness of breath.   Cardiovascular: Negative for chest pain, palpitations and leg swelling.   Gastrointestinal: Negative for abdominal pain, blood in stool, constipation, diarrhea, nausea and vomiting.  Genitourinary: Negative for dysuria.  Musculoskeletal: Negative for falls and myalgias.  Skin: Negative for rash.  Neurological: Negative for dizziness.  Psychiatric/Behavioral: Negative for depression. The patient is not nervous/anxious.       Past Medical History:  Diagnosis Date  . Allergies    has drainage from seasonal allergies  . Arthritis   . Dermatophytosis of nail   . Diabetes mellitus without complication ([place])    type 2  ,  does not monitor cbgs  at home    . Elevated blood pressure reading without diagnosis of hypertension   . Ganglion of tendon sheath   . Hypertension   . 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 Pain in joint, multiple sites   . PCOS (polycystic ovarian syndrome)   . Pure hypercholesterolemia   . Seasonal allergies   . Unspecified disorder of skin and subcutaneous tissue     reports that she has never smoked. She has never used smokeless tobacco. She reports that she does not drink alcohol or use drugs.   Current Outpatient Medications:  .  acetaminophen (TYLENOL) 500 MG tablet, Take 1,500 mg by mouth daily as needed for moderate pain or headache., Disp: , Rfl:  .  atorvastatin (LIPITOR) 20 MG tablet, Take 1 tablet (20 mg total) by mouth daily., Disp: 90 tablet, Rfl: 3 .  Calcium Citrate-Vitamin D (CALCIUM + D PO), Take 1 tablet by mouth daily., Disp: , Rfl:  .  cyclobenzaprine (FLEXERIL) 10 MG tablet, TAKE 0.5-1 TABLETS (5-10  MG TOTAL) BY MOUTH AT BEDTIME., Disp: 30 tablet, Rfl: 1 .  diclofenac (VOLTAREN) 75 MG EC tablet, Take 1 tablet (75 mg total) by mouth 2 (two) times daily., Disp: 180 tablet, Rfl: 0 .  gabapentin (NEURONTIN) 300 MG capsule, TAKE 1 CAPSULE BY MOUTH EVERYDAY AT BEDTIME, Disp: 90 capsule, Rfl: 1 .  GLUCOSAMINE-CHONDROITIN PO, Take 2 tablets by mouth daily., Disp: , Rfl:  .  hydrochlorothiazide  (HYDRODIURIL) 25 MG tablet, TAKE 1 TABLET BY MOUTH EVERY DAY WITH LOSARTAN, Disp: 90 tablet, Rfl: 1 .  levonorgestrel (MIRENA) 20 MCG/24HR IUD, 1 each by Intrauterine route once., Disp: , Rfl:  .  losartan (COZAAR) 100 MG tablet, TAKE 1 TABLET BY MOUTH EVERY DAY (TAKE WITH HCTZ), Disp: 90 tablet, Rfl: 1 .  metFORMIN (GLUCOPHAGE-XR) 500 MG 24 hr tablet, Take 1,000 mg by mouth daily with breakfast., Disp: , Rfl:  .  Multiple Vitamins-Minerals (MULTIVITAMIN WITH MINERALS) tablet, Take 1 tablet by mouth daily., Disp: , Rfl:  .  triamcinolone (NASACORT ALLERGY 24HR) 55 MCG/ACT AERO nasal inhaler, Place 1 spray into the nose daily. , Disp: , Rfl:  .  aspirin EC 325 MG tablet, Take 1 tablet (325 mg total) by mouth 2 (two) times daily. (Patient not taking: Reported on 03/15/2019), Disp: 60 tablet, Rfl: 0   Observations/Objective: Temperature 97.9 F (36.6 C), temperature source Oral, height 5\' 9"  (1.753 m), weight 270 lb (122.5 kg). BP Readings from Last 3 Encounters:  03/20/19 (!) 139/98  02/01/19 134/88  01/24/19 (!) 158/87    Physical Exam  Physical Exam Constitutional:      General: The patient is not in acute distress. Pulmonary:     Effort: Pulmonary effort is normal. No respiratory distress.  Neurological:     Mental Status: The patient is alert and oriented to person, place, and time.  Psychiatric:        Mood and Affect: Mood normal.        Behavior: Behavior normal.   Assessment and Plan Diabetes mellitus with no complication Well controlled. Continue current medication. Excellent control on metformin and with diet control. Encouraged exercise, weight loss, healthy eating habits.   HTN (hypertension) Well controlled. Continue current medication.      I discussed the assessment and treatment plan with the patient. The patient was provided an opportunity to ask questions and all were answered. The patient agreed with the plan and demonstrated an understanding of the  instructions.   The patient was advised to call back or seek an in-person evaluation if the symptoms worsen or if the condition fails to improve as anticipated.     Eliezer Lofts, MD

## 2019-03-26 LAB — NOVEL CORONAVIRUS, NAA (HOSP ORDER, SEND-OUT TO REF LAB; TAT 18-24 HRS): SARS-CoV-2, NAA: NOT DETECTED

## 2019-03-27 MED ORDER — DEXTROSE 5 % IV SOLN
3.0000 g | INTRAVENOUS | Status: AC
  Administered 2019-03-28: 3 g via INTRAVENOUS
  Filled 2019-03-27: qty 3

## 2019-03-28 ENCOUNTER — Ambulatory Visit (HOSPITAL_COMMUNITY): Payer: Managed Care, Other (non HMO) | Admitting: Physician Assistant

## 2019-03-28 ENCOUNTER — Observation Stay (HOSPITAL_COMMUNITY): Payer: Managed Care, Other (non HMO)

## 2019-03-28 ENCOUNTER — Other Ambulatory Visit: Payer: Self-pay

## 2019-03-28 ENCOUNTER — Observation Stay (HOSPITAL_COMMUNITY)
Admission: RE | Admit: 2019-03-28 | Discharge: 2019-03-29 | Disposition: A | Discharge status: Home / Self Care | Payer: Managed Care, Other (non HMO) | Source: Other Acute Inpatient Hospital | Attending: Orthopedic Surgery | Admitting: Orthopedic Surgery

## 2019-03-28 ENCOUNTER — Encounter (HOSPITAL_COMMUNITY): Payer: Self-pay

## 2019-03-28 ENCOUNTER — Ambulatory Visit (HOSPITAL_COMMUNITY): Payer: Managed Care, Other (non HMO) | Admitting: Anesthesiology

## 2019-03-28 ENCOUNTER — Encounter (HOSPITAL_COMMUNITY)
Admission: RE | Disposition: A | Discharge status: Home / Self Care | Payer: Self-pay | Source: Other Acute Inpatient Hospital | Attending: Orthopedic Surgery

## 2019-03-28 DIAGNOSIS — Z7982 Long term (current) use of aspirin: Secondary | ICD-10-CM | POA: Insufficient documentation

## 2019-03-28 DIAGNOSIS — Z96641 Presence of right artificial hip joint: Secondary | ICD-10-CM | POA: Diagnosis not present

## 2019-03-28 DIAGNOSIS — M1632 Unilateral osteoarthritis resulting from hip dysplasia, left hip: Principal | ICD-10-CM

## 2019-03-28 DIAGNOSIS — Z7984 Long term (current) use of oral hypoglycemic drugs: Secondary | ICD-10-CM | POA: Insufficient documentation

## 2019-03-28 DIAGNOSIS — Z793 Long term (current) use of hormonal contraceptives: Secondary | ICD-10-CM | POA: Diagnosis not present

## 2019-03-28 DIAGNOSIS — I10 Essential (primary) hypertension: Secondary | ICD-10-CM | POA: Diagnosis not present

## 2019-03-28 DIAGNOSIS — E669 Obesity, unspecified: Secondary | ICD-10-CM | POA: Insufficient documentation

## 2019-03-28 DIAGNOSIS — E119 Type 2 diabetes mellitus without complications: Secondary | ICD-10-CM | POA: Diagnosis not present

## 2019-03-28 DIAGNOSIS — Z96649 Presence of unspecified artificial hip joint: Secondary | ICD-10-CM

## 2019-03-28 DIAGNOSIS — Z791 Long term (current) use of non-steroidal anti-inflammatories (NSAID): Secondary | ICD-10-CM | POA: Diagnosis not present

## 2019-03-28 DIAGNOSIS — Z6839 Body mass index (BMI) 39.0-39.9, adult: Secondary | ICD-10-CM | POA: Diagnosis not present

## 2019-03-28 DIAGNOSIS — Z419 Encounter for procedure for purposes other than remedying health state, unspecified: Secondary | ICD-10-CM

## 2019-03-28 DIAGNOSIS — Z96642 Presence of left artificial hip joint: Secondary | ICD-10-CM

## 2019-03-28 DIAGNOSIS — Z79899 Other long term (current) drug therapy: Secondary | ICD-10-CM | POA: Diagnosis not present

## 2019-03-28 HISTORY — DX: Unilateral osteoarthritis resulting from hip dysplasia, left hip: M16.32

## 2019-03-28 HISTORY — PX: TOTAL HIP ARTHROPLASTY: SHX124

## 2019-03-28 LAB — GLUCOSE, CAPILLARY
Glucose-Capillary: 123 mg/dL — ABNORMAL HIGH (ref 70–99)
Glucose-Capillary: 94 mg/dL (ref 70–99)
Glucose-Capillary: 175 mg/dL — ABNORMAL HIGH (ref 70–99)

## 2019-03-28 LAB — PREGNANCY, URINE: Preg Test, Ur: NEGATIVE

## 2019-03-28 SURGERY — ARTHROPLASTY, HIP, TOTAL,POSTERIOR APPROACH
Anesthesia: Spinal | Site: Hip | Laterality: Left

## 2019-03-28 MED ORDER — METHOCARBAMOL 500 MG PO TABS
500.0000 mg | ORAL_TABLET | Freq: Four times a day (QID) | ORAL | Status: DC | PRN
  Administered 2019-03-28 – 2019-03-29 (×3): 500 mg via ORAL
  Filled 2019-03-28 (×3): qty 1

## 2019-03-28 MED ORDER — PROPOFOL 500 MG/50ML IV EMUL
INTRAVENOUS | Status: AC
  Filled 2019-03-28: qty 50

## 2019-03-28 MED ORDER — KETOROLAC TROMETHAMINE 15 MG/ML IJ SOLN
7.5000 mg | Freq: Four times a day (QID) | INTRAMUSCULAR | Status: DC
  Administered 2019-03-28 – 2019-03-29 (×3): 7.5 mg via INTRAVENOUS
  Filled 2019-03-28 (×3): qty 1

## 2019-03-28 MED ORDER — MIDAZOLAM HCL 2 MG/2ML IJ SOLN
INTRAMUSCULAR | Status: AC
  Filled 2019-03-28: qty 2

## 2019-03-28 MED ORDER — OXYCODONE HCL 5 MG PO TABS
5.0000 mg | ORAL_TABLET | ORAL | Status: DC | PRN
  Administered 2019-03-28 – 2019-03-29 (×2): 10 mg via ORAL
  Filled 2019-03-28 (×2): qty 2

## 2019-03-28 MED ORDER — ZOLPIDEM TARTRATE 5 MG PO TABS: 5.0000 mg | ORAL_TABLET | Freq: Every evening | ORAL | Status: DC | PRN

## 2019-03-28 MED ORDER — OXYCODONE HCL 5 MG PO TABS: 5.0000 mg | ORAL_TABLET | Freq: Once | ORAL | Status: DC | PRN

## 2019-03-28 MED ORDER — POLYETHYLENE GLYCOL 3350 17 G PO PACK: 17.0000 g | PACK | Freq: Every day | ORAL | Status: DC | PRN

## 2019-03-28 MED ORDER — ASPIRIN EC 325 MG PO TBEC
325.0000 mg | DELAYED_RELEASE_TABLET | Freq: Two times a day (BID) | ORAL | Status: DC
  Administered 2019-03-28 – 2019-03-29 (×2): 325 mg via ORAL
  Filled 2019-03-28 (×2): qty 1

## 2019-03-28 MED ORDER — ALBUMIN HUMAN 5 % IV SOLN
INTRAVENOUS | Status: AC
  Filled 2019-03-28: qty 250

## 2019-03-28 MED ORDER — KETOROLAC TROMETHAMINE 30 MG/ML IJ SOLN
INTRAMUSCULAR | Status: DC | PRN
  Administered 2019-03-28: 30 mg

## 2019-03-28 MED ORDER — PROPOFOL 10 MG/ML IV BOLUS
INTRAVENOUS | Status: DC | PRN
  Administered 2019-03-28: 40 mg via INTRAVENOUS
  Administered 2019-03-28 (×2): 20 mg via INTRAVENOUS

## 2019-03-28 MED ORDER — OXYCODONE HCL 5 MG PO TABS
10.0000 mg | ORAL_TABLET | ORAL | Status: DC | PRN
  Administered 2019-03-28: 10 mg via ORAL
  Filled 2019-03-28: qty 2

## 2019-03-28 MED ORDER — FENTANYL CITRATE (PF) 100 MCG/2ML IJ SOLN
INTRAMUSCULAR | Status: AC
  Filled 2019-03-28: qty 2

## 2019-03-28 MED ORDER — PROPOFOL 10 MG/ML IV BOLUS
INTRAVENOUS | Status: AC
  Filled 2019-03-28: qty 20

## 2019-03-28 MED ORDER — TRANEXAMIC ACID-NACL 1000-0.7 MG/100ML-% IV SOLN
INTRAVENOUS | Status: AC
  Filled 2019-03-28: qty 100

## 2019-03-28 MED ORDER — BUPIVACAINE IN DEXTROSE 0.75-8.25 % IT SOLN
INTRATHECAL | Status: DC | PRN
  Administered 2019-03-28: 2 mL via INTRATHECAL

## 2019-03-28 MED ORDER — ALBUMIN HUMAN 5 % IV SOLN
INTRAVENOUS | Status: DC | PRN
  Administered 2019-03-28: 12:00:00 via INTRAVENOUS

## 2019-03-28 MED ORDER — DOCUSATE SODIUM 100 MG PO CAPS
100.0000 mg | ORAL_CAPSULE | Freq: Two times a day (BID) | ORAL | Status: DC
  Administered 2019-03-28 – 2019-03-29 (×2): 100 mg via ORAL
  Filled 2019-03-28 (×2): qty 1

## 2019-03-28 MED ORDER — STERILE WATER FOR IRRIGATION IR SOLN
Status: DC | PRN
  Administered 2019-03-28 (×2): 1000 mL

## 2019-03-28 MED ORDER — CEFAZOLIN SODIUM-DEXTROSE 2-4 GM/100ML-% IV SOLN
2.0000 g | Freq: Four times a day (QID) | INTRAVENOUS | Status: AC
  Administered 2019-03-28 (×2): 2 g via INTRAVENOUS
  Filled 2019-03-28 (×2): qty 100

## 2019-03-28 MED ORDER — MAGNESIUM CITRATE PO SOLN: 1.0000 | Freq: Once | ORAL | Status: DC | PRN

## 2019-03-28 MED ORDER — ONDANSETRON HCL 4 MG/2ML IJ SOLN
INTRAMUSCULAR | Status: DC | PRN
  Administered 2019-03-28: 4 mg via INTRAVENOUS

## 2019-03-28 MED ORDER — OXYCODONE HCL 5 MG/5ML PO SOLN: 5.0000 mg | Freq: Once | ORAL | Status: DC | PRN

## 2019-03-28 MED ORDER — ONDANSETRON HCL 4 MG/2ML IJ SOLN: 4.0000 mg | Freq: Once | INTRAMUSCULAR | Status: DC | PRN

## 2019-03-28 MED ORDER — DIPHENHYDRAMINE HCL 12.5 MG/5ML PO ELIX: 12.5000 mg | ORAL_SOLUTION | ORAL | Status: DC | PRN

## 2019-03-28 MED ORDER — METOCLOPRAMIDE HCL 5 MG/ML IJ SOLN: 5.0000 mg | Freq: Three times a day (TID) | INTRAMUSCULAR | Status: DC | PRN

## 2019-03-28 MED ORDER — KETOROLAC TROMETHAMINE 30 MG/ML IJ SOLN
INTRAMUSCULAR | Status: AC
  Filled 2019-03-28: qty 1

## 2019-03-28 MED ORDER — ONDANSETRON HCL 4 MG/2ML IJ SOLN
4.0000 mg | Freq: Four times a day (QID) | INTRAMUSCULAR | Status: DC | PRN
  Administered 2019-03-28: 4 mg via INTRAVENOUS
  Filled 2019-03-28: qty 2

## 2019-03-28 MED ORDER — MENTHOL 3 MG MT LOZG: 1.0000 | LOZENGE | OROMUCOSAL | Status: DC | PRN

## 2019-03-28 MED ORDER — HYDROMORPHONE HCL 1 MG/ML IJ SOLN: 0.5000 mg | INTRAMUSCULAR | Status: DC | PRN

## 2019-03-28 MED ORDER — METHOCARBAMOL 500 MG IVPB - SIMPLE MED
500.0000 mg | Freq: Four times a day (QID) | INTRAVENOUS | Status: DC | PRN
  Filled 2019-03-28: qty 50

## 2019-03-28 MED ORDER — MIDAZOLAM HCL 2 MG/2ML IJ SOLN
INTRAMUSCULAR | Status: DC | PRN
  Administered 2019-03-28: 2 mg via INTRAVENOUS

## 2019-03-28 MED ORDER — BUPIVACAINE HCL (PF) 0.25 % IJ SOLN
INTRAMUSCULAR | Status: DC | PRN
  Administered 2019-03-28: 30 mL

## 2019-03-28 MED ORDER — METOCLOPRAMIDE HCL 5 MG PO TABS: 5.0000 mg | ORAL_TABLET | Freq: Three times a day (TID) | ORAL | Status: DC | PRN

## 2019-03-28 MED ORDER — ALUM & MAG HYDROXIDE-SIMETH 200-200-20 MG/5ML PO SUSP: 30.0000 mL | ORAL | Status: DC | PRN

## 2019-03-28 MED ORDER — LIDOCAINE 2% (20 MG/ML) 5 ML SYRINGE
INTRAMUSCULAR | Status: AC
  Filled 2019-03-28: qty 5

## 2019-03-28 MED ORDER — PHENOL 1.4 % MT LIQD
1.0000 | OROMUCOSAL | Status: DC | PRN
  Filled 2019-03-28: qty 177

## 2019-03-28 MED ORDER — BISACODYL 10 MG RE SUPP: 10.0000 mg | Freq: Every day | RECTAL | Status: DC | PRN

## 2019-03-28 MED ORDER — DEXAMETHASONE SODIUM PHOSPHATE 10 MG/ML IJ SOLN
10.0000 mg | Freq: Once | INTRAMUSCULAR | Status: AC
  Administered 2019-03-29: 10 mg via INTRAVENOUS
  Filled 2019-03-28: qty 1

## 2019-03-28 MED ORDER — LACTATED RINGERS IV SOLN
INTRAVENOUS | Status: DC
  Administered 2019-03-28 (×3): via INTRAVENOUS

## 2019-03-28 MED ORDER — FENTANYL CITRATE (PF) 100 MCG/2ML IJ SOLN
INTRAMUSCULAR | Status: DC | PRN
  Administered 2019-03-28 (×2): 50 ug via INTRAVENOUS

## 2019-03-28 MED ORDER — PROPOFOL 500 MG/50ML IV EMUL
INTRAVENOUS | Status: DC | PRN
  Administered 2019-03-28: 75 ug/kg/min via INTRAVENOUS

## 2019-03-28 MED ORDER — FENTANYL CITRATE (PF) 100 MCG/2ML IJ SOLN: 25.0000 ug | INTRAMUSCULAR | Status: DC | PRN

## 2019-03-28 MED ORDER — TRANEXAMIC ACID 1000 MG/10ML IV SOLN
INTRAVENOUS | Status: DC | PRN
  Administered 2019-03-28: 1000 mg via INTRAVENOUS

## 2019-03-28 MED ORDER — OXYCODONE HCL 5 MG PO TABS: 5.0000 mg | ORAL_TABLET | ORAL | 0 refills | Status: DC | PRN

## 2019-03-28 MED ORDER — SENNA-DOCUSATE SODIUM 8.6-50 MG PO TABS: 2.0000 | ORAL_TABLET | Freq: Every day | ORAL | 1 refills | Status: DC

## 2019-03-28 MED ORDER — ASPIRIN EC 325 MG PO TBEC: 325.0000 mg | DELAYED_RELEASE_TABLET | Freq: Two times a day (BID) | ORAL | 0 refills | Status: DC

## 2019-03-28 MED ORDER — TRANEXAMIC ACID-NACL 1000-0.7 MG/100ML-% IV SOLN
1000.0000 mg | Freq: Once | INTRAVENOUS | Status: AC
  Administered 2019-03-28: 1000 mg via INTRAVENOUS
  Filled 2019-03-28: qty 100

## 2019-03-28 MED ORDER — ONDANSETRON HCL 4 MG PO TABS: 4.0000 mg | ORAL_TABLET | Freq: Four times a day (QID) | ORAL | Status: DC | PRN

## 2019-03-28 MED ORDER — BUPIVACAINE HCL (PF) 0.25 % IJ SOLN
INTRAMUSCULAR | Status: AC
  Filled 2019-03-28: qty 30

## 2019-03-28 MED ORDER — POTASSIUM CHLORIDE IN NACL 20-0.45 MEQ/L-% IV SOLN
INTRAVENOUS | Status: DC
  Administered 2019-03-28: 21:00:00 via INTRAVENOUS
  Filled 2019-03-28 (×3): qty 1000

## 2019-03-28 MED ORDER — SODIUM CHLORIDE 0.9 % IR SOLN
Status: DC | PRN
  Administered 2019-03-28: 1000 mL

## 2019-03-28 MED ORDER — CHLORHEXIDINE GLUCONATE 4 % EX LIQD: 60.0000 mL | Freq: Once | CUTANEOUS | Status: DC

## 2019-03-28 MED ORDER — ACETAMINOPHEN 500 MG PO TABS
1000.0000 mg | ORAL_TABLET | Freq: Four times a day (QID) | ORAL | Status: DC
  Administered 2019-03-28 – 2019-03-29 (×3): 1000 mg via ORAL
  Filled 2019-03-28 (×3): qty 2

## 2019-03-28 MED ORDER — ACETAMINOPHEN 325 MG PO TABS: 325.0000 mg | ORAL_TABLET | Freq: Four times a day (QID) | ORAL | Status: DC | PRN

## 2019-03-28 SURGICAL SUPPLY — 56 items
BIT DRILL 2.0X128 (BIT) ×2 IMPLANT
BIT DRILL 2.0X128MM (BIT) ×1
BLADE SAW SAG 73X25 THK (BLADE) ×1
BLADE SAW SGTL 73X25 THK (BLADE) ×2 IMPLANT
BLADE SURG SZ10 CARB STEEL (BLADE) ×6 IMPLANT
CLOSURE STERI-STRIP 1/2X4 (GAUZE/BANDAGES/DRESSINGS) ×1
CLSR STERI-STRIP ANTIMIC 1/2X4 (GAUZE/BANDAGES/DRESSINGS) ×3 IMPLANT
COVER SURGICAL LIGHT HANDLE (MISCELLANEOUS) ×3 IMPLANT
COVER WAND RF STERILE (DRAPES) IMPLANT
DRAPE INCISE IOBAN 66X45 STRL (DRAPES) ×3 IMPLANT
DRAPE ORTHO SPLIT 77X108 STRL (DRAPES) ×6
DRAPE POUCH INSTRU U-SHP 10X18 (DRAPES) ×3 IMPLANT
DRAPE SHEET LG 3/4 BI-LAMINATE (DRAPES) ×3 IMPLANT
DRAPE SURG 17X11 SM STRL (DRAPES) ×3 IMPLANT
DRAPE SURG ORHT 6 SPLT 77X108 (DRAPES) ×2 IMPLANT
DRAPE U-SHAPE 47X51 STRL (DRAPES) ×3 IMPLANT
DRSG MEPILEX BORDER 4X8 (GAUZE/BANDAGES/DRESSINGS) ×3 IMPLANT
DURAPREP 26ML APPLICATOR (WOUND CARE) ×6 IMPLANT
ELECT BLADE TIP CTD 4 INCH (ELECTRODE) ×3 IMPLANT
ELECT REM PT RETURN 15FT ADLT (MISCELLANEOUS) ×3 IMPLANT
ELIMINATOR HOLE APEX DEPUY (Hips) ×2 IMPLANT
GLOVE BIO SURGEON STRL SZ7.5 (GLOVE) ×3 IMPLANT
GLOVE BIO SURGEON STRL SZ8 (GLOVE) ×3 IMPLANT
GLOVE BIOGEL PI IND STRL 8 (GLOVE) ×2 IMPLANT
GLOVE BIOGEL PI INDICATOR 8 (GLOVE) ×4
GOWN STRL REUS W/TWL 2XL LVL3 (GOWN DISPOSABLE) ×3 IMPLANT
GOWN STRL REUS W/TWL LRG LVL3 (GOWN DISPOSABLE) ×3 IMPLANT
HEAD CERAMIC 36 PLUS5 (Hips) ×2 IMPLANT
HOOD PEEL AWAY FLYTE STAYCOOL (MISCELLANEOUS) ×9 IMPLANT
KIT BASIN OR (CUSTOM PROCEDURE TRAY) ×3 IMPLANT
KIT TURNOVER KIT A (KITS) IMPLANT
LINER NEUTRAL 52X36MM PLUS 4 (Liner) ×2 IMPLANT
MANIFOLD NEPTUNE II (INSTRUMENTS) ×3 IMPLANT
NEEDLE HYPO 22GX1.5 SAFETY (NEEDLE) ×3 IMPLANT
NS IRRIG 1000ML POUR BTL (IV SOLUTION) ×3 IMPLANT
PACK TOTAL JOINT (CUSTOM PROCEDURE TRAY) ×3 IMPLANT
PIN SECTOR W/GRIP ACE CUP 52MM (Hips) ×2 IMPLANT
PROTECTOR NERVE ULNAR (MISCELLANEOUS) ×3 IMPLANT
RETRIEVER SUT HEWSON (MISCELLANEOUS) ×3 IMPLANT
SCREW 6.5MMX25MM (Screw) ×2 IMPLANT
STEM FEM CMT STD SZ3 38 (Stem) ×2 IMPLANT
SUCTION FRAZIER HANDLE 12FR (TUBING) ×2
SUCTION TUBE FRAZIER 12FR DISP (TUBING) ×1 IMPLANT
SUT FIBERWIRE #2 38 REV NDL BL (SUTURE) ×9
SUT MNCRL AB 4-0 PS2 18 (SUTURE) IMPLANT
SUT VIC AB 0 CT1 36 (SUTURE) ×3 IMPLANT
SUT VIC AB 2-0 CT1 27 (SUTURE) ×6
SUT VIC AB 2-0 CT1 TAPERPNT 27 (SUTURE) ×2 IMPLANT
SUT VIC AB 3-0 SH 8-18 (SUTURE) ×3 IMPLANT
SUTURE FIBERWR#2 38 REV NDL BL (SUTURE) ×3 IMPLANT
SYR CONTROL 10ML LL (SYRINGE) ×3 IMPLANT
TOWEL OR 17X26 10 PK STRL BLUE (TOWEL DISPOSABLE) ×6 IMPLANT
TOWEL OR NON WOVEN STRL DISP B (DISPOSABLE) ×3 IMPLANT
TRAY FOLEY MTR SLVR 16FR STAT (SET/KITS/TRAYS/PACK) ×3 IMPLANT
WATER STERILE IRR 1000ML POUR (IV SOLUTION) ×6 IMPLANT
YANKAUER SUCT BULB TIP 10FT TU (MISCELLANEOUS) ×3 IMPLANT

## 2019-03-28 NOTE — H&P (Signed)
PREOPERATIVE H&P  Chief Complaint: left hip pain  HPI: Kathy Mccarthy is a 48 y.o. female who presents for preoperative history and physical with a diagnosis of left hip osteoarthritis secondary to dysplasia. Symptoms are rated as moderate to severe, and have been worsening.  This is significantly impairing activities of daily living.  She has elected for surgical management.  She already had her other side done and has done well with that.  She has failed injections, activity modification, anti-inflammatories, and assistive devices.  Preoperative X-rays demonstrate end stage degenerative changes with osteophyte formation, loss of joint space, subchondral sclerosis.   Past Medical History:  Diagnosis Date  . Allergies    has drainage from seasonal allergies  . Arthritis   . Dermatophytosis of nail   . Diabetes mellitus without complication ([place])    type 2  ,  does not monitor cbgs  at home    . Elevated blood pressure reading without diagnosis of hypertension   . Ganglion of tendon sheath   . Hypertension   . 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 Pain in joint, multiple sites   . PCOS (polycystic ovarian syndrome)   . Pure hypercholesterolemia   . Seasonal allergies   . Unspecified disorder of skin and subcutaneous tissue    Past Surgical History:  Procedure Laterality Date  . MASS EXCISION Right 08/09/2018   Procedure: EXCISION MASS RIGHT THUMB NAILBED;  Surgeon: Daryll Brod, MD;  Location: [facility];  Service: Orthopedics;  Laterality: Right;  FAB  . NO PAST SURGERIES    . TONSILLECTOMY  age 53  . TOTAL HIP ARTHROPLASTY Right 01/31/2019   Procedure: TOTAL HIP ARTHROPLASTY;  Surgeon: Marchia Bond, MD;  Location: [facility];  Service: Orthopedics;  Laterality: Right;   Social History   Socioeconomic History  . Marital status: Married    Spouse name: Not on file  . Number of children: Not on file  . Years of education: Not on file  .  Highest education level: Not on file  Occupational History  . Not on file  Social Needs  . Financial resource strain: Not on file  . Food insecurity    Worry: Not on file    Inability: Not on file  . Transportation needs    Medical: Not on file    Non-medical: Not on file  Tobacco Use  . Smoking status: Never Smoker  . Smokeless tobacco: Never Used  Substance and Sexual Activity  . Alcohol use: No  . Drug use: No  . Sexual activity: Not on file  Lifestyle  . Physical activity    Days per week: Not on file    Minutes per session: Not on file  . Stress: Not on file  Relationships  . Social Herbalist on phone: Not on file    Gets together: Not on file    Attends religious service: Not on file    Active member of club or organization: Not on file    Attends meetings of clubs or organizations: Not on file    Relationship status: Not on file  Other Topics Concern  . Not on file  Social History Narrative   Regular exercise-yes, lives with husband and daughter   Family History  Adopted: Yes  Problem Relation Age of Onset  . Stroke Mother        mother's side  . Deep vein thrombosis Mother        mother's  side   No Known Allergies Prior to Admission medications   Medication Sig Start Date End Date Taking? Authorizing Provider  acetaminophen (TYLENOL) 500 MG tablet Take 1,500 mg by mouth daily as needed for moderate pain or headache.   Yes [provider]  atorvastatin (LIPITOR) 20 MG tablet Take 1 tablet (20 mg total) by mouth daily. 08/19/18  Yes Bedsole, Amy E, MD  Calcium Citrate-Vitamin D (CALCIUM + D PO) Take 1 tablet by mouth daily.   Yes [provider]  cyclobenzaprine (FLEXERIL) 10 MG tablet TAKE 0.5-1 TABLETS (5-10 MG TOTAL) BY MOUTH AT BEDTIME. 03/17/19  Yes Bedsole, Amy E, MD  diclofenac (VOLTAREN) 75 MG EC tablet Take 1 tablet (75 mg total) by mouth 2 (two) times daily. 01/20/19  Yes Bedsole, Amy E, MD  gabapentin (NEURONTIN) 300 MG  capsule TAKE 1 CAPSULE BY MOUTH EVERYDAY AT BEDTIME 02/22/19  Yes Bedsole, Amy E, MD  GLUCOSAMINE-CHONDROITIN PO Take 2 tablets by mouth daily.   Yes [provider]  hydrochlorothiazide (HYDRODIURIL) 25 MG tablet TAKE 1 TABLET BY MOUTH EVERY DAY WITH LOSARTAN 01/31/19  Yes Bedsole, Amy E, MD  levonorgestrel (MIRENA) 20 MCG/24HR IUD 1 each by Intrauterine route once. 01/24/15  Yes [provider]  losartan (COZAAR) 100 MG tablet TAKE 1 TABLET BY MOUTH EVERY DAY (TAKE WITH HCTZ) 02/02/19  Yes Bedsole, Amy E, MD  metFORMIN (GLUCOPHAGE-XR) 500 MG 24 hr tablet Take 1,000 mg by mouth daily with breakfast.   Yes [provider]  Multiple Vitamins-Minerals (MULTIVITAMIN WITH MINERALS) tablet Take 1 tablet by mouth daily.   Yes [provider]  triamcinolone (NASACORT ALLERGY 24HR) 55 MCG/ACT AERO nasal inhaler Place 1 spray into the nose daily.    Yes [provider]  aspirin EC 325 MG tablet Take 1 tablet (325 mg total) by mouth 2 (two) times daily. Patient not taking: Reported on 03/15/2019 01/31/19   Marchia Bond, MD     Positive ROS: All other systems have been reviewed and were otherwise negative with the exception of those mentioned in the HPI and as above.  Physical Exam: General: Alert, no acute distress Cardiovascular: No pedal edema Respiratory: No cyanosis, no use of accessory musculature GI: No organomegaly, abdomen is soft and non-tender Skin: No lesions in the area of chief complaint Neurologic: Sensation intact distally Psychiatric: Patient is competent for consent with normal mood and affect Lymphatic: No axillary or cervical lymphadenopathy  MUSCULOSKELETAL: Left hip EHL and FHL are intact, painful arc of motion, very limited internal rotation.  Assessment: Left hip primary localized osteoarthritis secondary to dysplasia.   Plan: Plan for Procedure(s): TOTAL HIP ARTHROPLASTY  The risks benefits and alternatives were discussed with  the patient including but not limited to the risks of nonoperative treatment, versus surgical intervention including infection, bleeding, nerve injury,  blood clots, cardiopulmonary complications, morbidity, mortality, among others, and they were willing to proceed.    Patient's anticipated LOS is less than 2 midnights, meeting these requirements: - Younger than 7 - Lives within 1 hour of care - Has a competent adult at home to recover with post-op recover - NO history of  - Chronic pain requiring opiods  - Diabetes  - Coronary Artery Disease  - Heart failure  - Heart attack  - Stroke  - DVT/VTE  - Cardiac arrhythmia  - Respiratory Failure/COPD  - Renal failure  - Anemia  - Advanced Liver disease        Johnny Bridge, MD Cell [phone]  [phone]   03/28/2019 9:26 AM

## 2019-03-28 NOTE — Anesthesia Preprocedure Evaluation (Signed)
Anesthesia Evaluation  Patient identified by MRN, date of birth, ID band Patient awake    Reviewed: Allergy & Precautions, NPO status , Patient's Chart, lab work & pertinent test results  Airway Mallampati: III  TM Distance: >3 FB Neck ROM: Full    Dental no notable dental hx. (+) Teeth Intact   Pulmonary neg pulmonary ROS,    Pulmonary exam normal        Cardiovascular hypertension, Pt. on medications Normal cardiovascular exam     Neuro/Psych negative neurological ROS  negative psych ROS   GI/Hepatic negative GI ROS, Neg liver ROS,   Endo/Other  diabetes, Type 2, Oral Hypoglycemic AgentsMorbid obesity  Renal/GU      Musculoskeletal  (+) Arthritis , Osteoarthritis,    Abdominal (+) + obese,   Peds  Hematology negative hematology ROS (+)   Anesthesia Other Findings plts 226  Reproductive/Obstetrics negative OB ROS                             Anesthesia Physical  Anesthesia Plan  ASA: III  Anesthesia Plan: Spinal   Post-op Pain Management:    Induction:   PONV Risk Score and Plan: 3 and Propofol infusion, TIVA, Treatment may vary due to age or medical condition and Midazolam  Airway Management Planned: Natural Airway, Nasal Cannula and Simple Face Mask  Additional Equipment: None  Intra-op Plan:   Post-operative Plan:   Informed Consent: I have reviewed the patients History and Physical, chart, labs and discussed the procedure including the risks, benefits and alternatives for the proposed anesthesia with the patient or authorized representative who has indicated his/her understanding and acceptance.       Plan Discussed with:   Anesthesia Plan Comments: (See PAT note 01/24/2019, Konrad Felix, PA-C)        Anesthesia Quick Evaluation

## 2019-03-28 NOTE — Evaluation (Addendum)
Physical Therapy Evaluation Patient Details Name: Kathy Mccarthy MRN: [id number] DOB: [birth year] Today's Date: 03/28/2019   History of Present Illness  Patient is 48 y.o. female s/p Lt THA posterolateral approach with PMH significant for HTN, OA, DM, and Rt THA on 01/31/19.    Clinical Impression  Kathy Mccarthy is a 47 y.o. female POD 0 s/p Lt THA. Patient reports independence with mobility at baseline however she was using [facility] prior to surgery due to Lt hip pain. Patient is now limited by functional impairments (see PT problem list below) and requires min guard assist for transfers and gait with RW. Patient was able to ambulate ~100 feet with RW and demonstrated good safety awareness with use of walker. Patient instructed in exercise to facilitate ROM and circulation to manage edema. Patient will benefit from continued skilled PT interventions to address impairments and progress towards PLOF. Acute PT will follow to progress mobility and stair training in preparation for safe discharge home.     Follow Up Recommendations Follow surgeon's recommendation for DC plan and follow-up therapies    Equipment Recommendations  None recommended by PT    Recommendations for Other Services       Precautions / Restrictions Precautions Precautions: Posterior Hip Precaution Booklet Issued: Yes (comment) Precaution Comments: instructed pt/spouse in posterior hip precautions; issued handout Restrictions Weight Bearing Restrictions: No Other Position/Activity Restrictions: WBAT Lt LE      Mobility  Bed Mobility Overal bed mobility: Needs Assistance Bed Mobility: Supine to Sit     Supine to sit: Modified independent (Device/Increase time);HOB elevated     General bed mobility comments: cues for sequencing, min assist to raise trunk upright and sit EOB, pt limited by Lt hand pain secondary to IV  Transfers Overall transfer level: Needs assistance Equipment used: Rolling walker (2  wheeled) Transfers: Sit to/from Stand Sit to Stand: From elevated surface;Min guard         General transfer comment: verbal cues for safe hand placement and technique, in gaurd for rise, pt able to initiate power up without assist  Ambulation/Gait Ambulation/Gait assistance: Min guard Gait Distance (Feet): 120 Feet Assistive device: Rolling walker (2 wheeled) Gait Pattern/deviations: Step-to pattern;Decreased step length - left;Decreased step length - right;Decreased stride length;Decreased weight shift to left Gait velocity: decreased   General Gait Details: cues for sfae hand placement and sequencing step pattern in RW, no over LOB noted  Stairs            Wheelchair Mobility    Modified Rankin (Stroke Patients Only)       Balance Overall balance assessment: Mild deficits observed, not formally tested            Pertinent Vitals/Pain Pain Assessment: 0-10 Pain Score: 3  Pain Location: R hip Pain Descriptors / Indicators: Sore Pain Intervention(s): Limited activity within patient's tolerance;Monitored during session;Ice applied;Repositioned    Home Living Family/patient expects to be discharged to:: Private residence Living Arrangements: Spouse/significant other;Children Available Help at Discharge: Family;Available 24 hours/day Type of Home: House Home Access: Stairs to enter Entrance Stairs-Rails: Right Entrance Stairs-Number of Steps: 2 Home Layout: Two level;Bed/bath upstairs Home Equipment: Shower seat - built in;Walker - 2 wheels;Bedside commode;Cane - single point Additional Comments: used RW for 3 days after last surgery, was using cane prior to this surgery for Lt hip pain    Prior Function Level of Independence: Independent         Comments: Pt runs [employer]  Hand Dominance   Dominant Hand: Right    Extremity/Trunk Assessment   Upper Extremity Assessment Upper Extremity Assessment: Overall WFL for tasks assessed     Lower Extremity Assessment Lower Extremity Assessment: LLE deficits/detail LLE Deficits / Details: pt with 3/5 or greater for knee extension LLE Sensation: WNL LLE Coordination: WNL    Cervical / Trunk Assessment Cervical / Trunk Assessment: Normal  Communication   Communication: No difficulties  Cognition Arousal/Alertness: Awake/alert Behavior During Therapy: WFL for tasks assessed/performed Overall Cognitive Status: Within Functional Limits for tasks assessed           General Comments      Exercises Total Joint Exercises Ankle Circles/Pumps: AROM;Both;10 reps;Seated Long Arc Quad: AROM;10 reps;Seated;Left   Assessment/Plan    PT Assessment Patient needs continued PT services  PT Problem List         PT Treatment Interventions DME instruction;Gait training;Stair training;Functional mobility training;Therapeutic exercise;Patient/family education;Therapeutic activities;Balance training;Modalities    PT Goals (Current goals can be found in the Care Plan section)  Acute Rehab PT Goals Patient Stated Goal: return home and begin walking with no device PT Goal Formulation: With patient Time For Goal Achievement: 04/04/19 Potential to Achieve Goals: Good    Frequency 7X/week    AM-PAC PT "6 Clicks" Mobility  Outcome Measure Help needed turning from your back to your side while in a flat bed without using bedrails?: A Little Help needed moving from lying on your back to sitting on the side of a flat bed without using bedrails?: A Little Help needed moving to and from a bed to a chair (including a wheelchair)?: A Little Help needed standing up from a chair using your arms (e.g., wheelchair or bedside chair)?: A Little Help needed to walk in hospital room?: A Little Help needed climbing 3-5 steps with a railing? : A Little 6 Click Score: 18    End of Session Equipment Utilized During Treatment: Gait belt Activity Tolerance: Patient tolerated treatment  well Patient left: in chair;with call bell/phone within reach;with chair alarm set;with family/visitor present Nurse Communication: Mobility status PT Visit Diagnosis: Difficulty in walking, not elsewhere classified (R26.2);Pain Pain - Right/Left: Right Pain - part of body: Hip    Time: 1551-1611 PT Time Calculation (min) (ACUTE ONLY): 20 min   Charges:   PT Evaluation $PT Eval Low Complexity: 1 Low          Kipp Brood, PT, DPT, [facility] Physical Therapist with [facility]  03/28/2019 4:39 PM

## 2019-03-28 NOTE — Anesthesia Postprocedure Evaluation (Signed)
Anesthesia Post Note  Patient: Kathy Mccarthy  Procedure(s) Performed: TOTAL HIP ARTHROPLASTY (Left Hip)     Patient location during evaluation: PACU Anesthesia Type: Spinal Level of consciousness: oriented and awake and alert Pain management: pain level controlled Vital Signs Assessment: post-procedure vital signs reviewed and stable Respiratory status: spontaneous breathing, respiratory function stable, nonlabored ventilation and patient connected to nasal cannula oxygen Cardiovascular status: blood pressure returned to baseline and stable Postop Assessment: no headache, no backache, no apparent nausea or vomiting and spinal receding Anesthetic complications: no    Last Vitals:  Vitals:   03/28/19 1345 03/28/19 1400  BP: (!) 144/93 (!) 132/95  Pulse: 80 80  Resp: 15 15  Temp: 36.6 C   SpO2: 100% 100%    Last Pain:  Vitals:   03/28/19 1345  TempSrc:   PainSc: 0-No pain                 Lidia Collum

## 2019-03-28 NOTE — Transfer of Care (Signed)
Immediate Anesthesia Transfer of Care Note  Patient: Kathy Mccarthy  Procedure(s) Performed: TOTAL HIP ARTHROPLASTY (Left Hip)  Patient Location: PACU  Anesthesia Type:Spinal  Level of Consciousness: awake, alert , oriented and patient cooperative  Airway & Oxygen Therapy: Patient Spontanous Breathing and Patient connected to face mask oxygen  Post-op Assessment: Report given to RN and Post -op Vital signs reviewed and stable  Post vital signs: Reviewed and stable  Last Vitals:  Vitals Value Taken Time  BP 142/95 03/28/19 1250  Temp    Pulse 94 03/28/19 1253  Resp 19 03/28/19 1253  SpO2 100 % 03/28/19 1253  Vitals shown include unvalidated device data.  Last Pain:  Vitals:   03/28/19 0814  TempSrc:   PainSc: 3       Patients Stated Pain Goal: 2 ([id number])  Complications: No apparent anesthesia complications

## 2019-03-28 NOTE — Anesthesia Procedure Notes (Signed)
Spinal  Patient location during procedure: OR Start time: 03/28/2019 10:17 AM End time: 03/28/2019 10:20 AM Staffing Anesthesiologist: Lidia Collum, MD Resident/CRNA: Raenette Rover, CRNA Performed: resident/CRNA  Preanesthetic Checklist Completed: patient identified, site marked, surgical consent, pre-op evaluation, timeout performed, IV checked, risks and benefits discussed and monitors and equipment checked Spinal Block Patient position: sitting Prep: DuraPrep Patient monitoring: blood pressure, continuous pulse ox and heart rate Approach: midline Location: L3-4 Injection technique: single-shot Needle Needle type: Pencan  Needle gauge: 24 G Assessment Sensory level: T4

## 2019-03-28 NOTE — Discharge Instructions (Signed)

## 2019-03-28 NOTE — Op Note (Signed)
03/28/2019  12:03 PM  PATIENT:  Kathy Mccarthy   MRN: [id number]  PRE-OPERATIVE DIAGNOSIS:  Left hip dysplasia with osteoarthritis  POST-OPERATIVE DIAGNOSIS:  same  PROCEDURE:  Procedure(s): LEFT TOTAL HIP ARTHROPLASTY  PREOPERATIVE INDICATIONS:    Kathy Mccarthy is an 48 y.o. female who has a diagnosis of left hip osteoarthritis secondary to hip dysplasia and elected for surgical management after failing conservative treatment.  The risks benefits and alternatives were discussed with the patient including but not limited to the risks of nonoperative treatment, versus surgical intervention including infection, bleeding, nerve injury, periprosthetic fracture, the need for revision surgery, dislocation, leg length discrepancy, blood clots, cardiopulmonary complications, morbidity, mortality, among others, and they were willing to proceed.     OPERATIVE REPORT     SURGEON:  Marchia Bond, MD    ASSISTANT:  Joya Gaskins, OPA-C  (Present throughout the entire procedure,  necessary for completion of procedure in a timely manner, assisting with retraction, instrumentation, and closure)     ANESTHESIA: Spinal  ESTIMATED BLOOD LOSS: 314 mL    COMPLICATIONS:  None.     UNIQUE ASPECTS OF THE CASE: Her anatomy was similar to the contralateral side, the musculature of the gluteus extended distal to the trochanter.  I had to release the capsule superiorly a fair amount in order to get to the top of the acetabulum.  She had significant anterior cystic changes in the acetabulum which I placed some bone graft into.  She had a moderate amount of superior and posterior shell showing, despite having medialized, and I was still able to see a slight amount of anterior wall, which was my reference for appropriate anteversion.  COMPONENTS:  [employer] fit femur size 2 with a 36 mm + 5 ceramic head ball and a Gription Acetabular shell size 52, with a single cancellous screw for backup fixation,  with an apex hole eliminator and a +4 neutral polyethylene liner.    PROCEDURE IN DETAIL:   The patient was met in the holding area and  identified.  The appropriate hip was identified and marked at the operative site.  The patient was then transported to the OR  and  placed under anesthesia.  At that point, the patient was  placed in the lateral decubitus position with the operative side up and  secured to the operating room table and all bony prominences padded.     The operative lower extremity was prepped from the iliac crest to the distal leg.  Sterile draping was performed.  Time out was performed prior to incision.      A routine posterolateral approach was utilized via sharp dissection  carried down to the subcutaneous tissue.  Gross bleeders were Bovie coagulated.  The iliotibial band was identified and incised along the length of the skin incision.  Self-retaining retractors were  inserted.  With the hip internally rotated, the short external rotators  were identified. The piriformis and capsule was tagged with FiberWire, and the hip capsule released in a T-type fashion.  The femoral neck was exposed, and I resected the femoral neck using the appropriate jig. This was performed at approximately a thumb's breadth above the lesser trochanter.    I then exposed the deep acetabulum, cleared out any tissue including the ligamentum teres.  A wing retractor was placed.  After adequate visualization, I excised the labrum, and then sequentially reamed.  I placed the trial acetabulum, which seated nicely, and then impacted the real cup  into place.  Appropriate version and inclination was confirmed clinically matching their bony anatomy, and also with the use of the jig.  I placed a cancellous screw to augment fixation.  A trial polyethylene liner was placed and the wing retractor removed.    I then prepared the proximal femur using the cookie-cutter, the lateralizing reamer, and then sequentially  reamed and broached.  A trial broach, neck, and head was utilized, and I reduced the hip and it was found to have excellent stability with functional range of motion. The trial components were then removed, and the real polyethylene liner was placed.  I then impacted the real femoral prosthesis into place into the appropriate version, slightly anteverted to the normal anatomy, and I impacted the real head ball into place. The hip was then reduced and taken through functional range of motion and found to have excellent stability. Leg lengths were restored.  I then used a 2 mm drill bits to pass the FiberWire suture from the capsule and piriformis through the greater trochanter, and secured this. Excellent posterior capsular repair was achieved. I also closed the T in the capsule.  I then irrigated the hip copiously again with pulse lavage, and repaired the fascia with Vicryl, followed by Vicryl for the subcutaneous tissue, Monocryl for the skin, Steri-Strips and sterile gauze. The wounds were injected. The patient was then awakened and returned to PACU in stable and satisfactory condition. There were no complications.  Marchia Bond, MD Orthopedic Surgeon [phone]   03/28/2019 12:03 PM

## 2019-03-29 ENCOUNTER — Other Ambulatory Visit: Payer: Self-pay

## 2019-03-29 ENCOUNTER — Encounter (HOSPITAL_COMMUNITY): Payer: Self-pay | Admitting: Orthopedic Surgery

## 2019-03-29 DIAGNOSIS — M1632 Unilateral osteoarthritis resulting from hip dysplasia, left hip: Secondary | ICD-10-CM | POA: Diagnosis not present

## 2019-03-29 LAB — CBC
HCT: 32.7 % — ABNORMAL LOW (ref 36.0–46.0)
Hemoglobin: 10.4 g/dL — ABNORMAL LOW (ref 12.0–15.0)
MCH: 30.9 pg (ref 26.0–34.0)
MCHC: 31.8 g/dL (ref 30.0–36.0)
MCV: 97 fL (ref 80.0–100.0)
Platelets: 151 10*3/uL (ref 150–400)
RBC: 3.37 MIL/uL — ABNORMAL LOW (ref 3.87–5.11)
RDW: 12.5 % (ref 11.5–15.5)
WBC: 6.6 10*3/uL (ref 4.0–10.5)
nRBC: 0 % (ref 0.0–0.2)

## 2019-03-29 LAB — BASIC METABOLIC PANEL
Anion gap: 15 (ref 5–15)
BUN: 16 mg/dL (ref 6–20)
CO2: 19 mmol/L — ABNORMAL LOW (ref 22–32)
Calcium: 8.4 mg/dL — ABNORMAL LOW (ref 8.9–10.3)
Chloride: 102 mmol/L (ref 98–111)
Creatinine, Ser: 0.73 mg/dL (ref 0.44–1.00)
GFR calc Af Amer: >60 mL/min (ref >60)
GFR calc non Af Amer: >60 mL/min (ref >60)
Glucose, Bld: 133 mg/dL — ABNORMAL HIGH (ref 70–99)
Potassium: 3.4 mmol/L — ABNORMAL LOW (ref 3.5–5.1)
Sodium: 136 mmol/L (ref 135–145)

## 2019-03-29 NOTE — Progress Notes (Signed)
Physical Therapy Treatment Patient Details Name: Kathy Mccarthy MRN: [id number] DOB: 1970/10/31 Today's Date: 03/29/2019    History of Present Illness Patient is 48 y.o. female s/p Lt THA posterolateral approach with PMH significant for HTN, OA, DM, and Rt THA on 01/31/19.    PT Comments    POD # 1 Assisted OOB to amb to bathroom.  Assisted in bathroom at Supervision level.  Assisted amb in hallway an increased distance.  Practiced stairs.  Addressed all mobility questions, discussed appropriate activity, educated on use of ICE.  Pt ready for D/C to home.   Follow Up Recommendations  Follow surgeon's recommendation for DC plan and follow-up therapies     Equipment Recommendations  None recommended by PT(has from prior)    Recommendations for Other Services       Precautions / Restrictions Precautions Precautions: Posterior Hip Precaution Booklet Issued: Yes (comment) Precaution Comments: instructed pt/spouse in posterior hip precautions; issued handout Restrictions Weight Bearing Restrictions: No Other Position/Activity Restrictions: WBAT Lt LE    Mobility  Bed Mobility Overal bed mobility: Modified Independent             General bed mobility comments: increased time  Transfers Overall transfer level: Needs assistance Equipment used: Rolling walker (2 wheeled) Transfers: Sit to/from Stand Sit to Stand: Supervision         General transfer comment: good safety cognition and use of hands to steady self  Ambulation/Gait Ambulation/Gait assistance: Supervision Gait Distance (Feet): 115 Feet Assistive device: Rolling walker (2 wheeled) Gait Pattern/deviations: Step-to pattern;Decreased step length - left;Decreased step length - right;Decreased stride length;Decreased weight shift to left Gait velocity: decreased   General Gait Details: good safety cognition and proper use of AD   Stairs Stairs: Yes Stairs assistance: Supervision Stair Management: One  rail Right;Forwards;Step to pattern Number of Stairs: 2 General stair comments: one initial VC on safety   Wheelchair Mobility    Modified Rankin (Stroke Patients Only)       Balance                                            Cognition   Behavior During Therapy: WFL for tasks assessed/performed Overall Cognitive Status: Within Functional Limits for tasks assessed                                        Exercises      General Comments        Pertinent Vitals/Pain Pain Assessment: Faces Pain Score: 3  Pain Location: R hip Pain Descriptors / Indicators: Sore;Tender Pain Intervention(s): Monitored during session;Repositioned;Ice applied    Home Living                      Prior Function            PT Goals (current goals can now be found in the care plan section) Progress towards PT goals: Progressing toward goals    Frequency    7X/week      PT Plan Current plan remains appropriate    Co-evaluation              AM-PAC PT "6 Clicks" Mobility   Outcome Measure  Help needed turning from your back to your side while in a  flat bed without using bedrails?: None Help needed moving from lying on your back to sitting on the side of a flat bed without using bedrails?: None Help needed moving to and from a bed to a chair (including a wheelchair)?: None Help needed standing up from a chair using your arms (e.g., wheelchair or bedside chair)?: None Help needed to walk in hospital room?: None Help needed climbing 3-5 steps with a railing? : A Little 6 Click Score: 23    End of Session Equipment Utilized During Treatment: Gait belt Activity Tolerance: Patient tolerated treatment well Patient left: in chair;with call bell/phone within reach;with chair alarm set;with family/visitor present Nurse Communication: Mobility status(pt ready for D/C to home) PT Visit Diagnosis: Difficulty in walking, not elsewhere  classified (R26.2);Pain Pain - Right/Left: Right Pain - part of body: Hip     Time: [id number] PT Time Calculation (min) (ACUTE ONLY): 24 min  Charges:  $Gait Training: 8-22 mins $Therapeutic Activity: 8-22 mins                     Rica Koyanagi  PTA Acute  Rehabilitation Services Pager      [phone] Office      [phone]

## 2019-03-29 NOTE — Progress Notes (Addendum)
Patient ID: Kathy Mccarthy, female   DOB: 07/18/1970, 48 y.o.   MRN: [id number]     Subjective:  Patient reports pain as mild.  Patient in bed and ready to go home  Objective:   VITALS:   Vitals:   03/28/19 1641 03/28/19 2146 03/29/19 0044 03/29/19 0429  BP: (!) 148/99 (!) 132/91 123/74 133/89  Pulse: 98 (!) 105 96 93  Resp: 16 18 18 18   Temp: 98.2 F (36.8 C) 98.3 F (36.8 C) 98.4 F (36.9 C) 98 F (36.7 C)  TempSrc:      SpO2: 96% 94% 95% 94%  Weight:      Height:        ABD soft Sensation intact distally Dorsiflexion/Plantar flexion intact Incision: dressing C/D/I and no drainage   Lab Results  Component Value Date   WBC 6.6 03/29/2019   HGB 10.4 (L) 03/29/2019   HCT 32.7 (L) 03/29/2019   MCV 97.0 03/29/2019   PLT 151 03/29/2019   BMET    Component Value Date/Time   NA 136 03/29/2019 0211   K 3.4 (L) 03/29/2019 0211   CL 102 03/29/2019 0211   CO2 19 (L) 03/29/2019 0211   GLUCOSE 133 (H) 03/29/2019 0211   BUN 16 03/29/2019 0211   CREATININE 0.73 03/29/2019 0211   CALCIUM 8.4 (L) 03/29/2019 0211   GFRNONAA >60 03/29/2019 0211   GFRAA >60 03/29/2019 0211     Assessment/Plan: 1 Day Post-Op   Principal Problem:   Osteoarthritis resulting from left hip dysplasia Active Problems:   Status post total hip replacement, left   Advance diet Up with therapy DC home today WBAT Dry dressing PRN Follow up with Dr Mardelle Matte as scheduled   Lunette Stands 03/29/2019, 8:10 AM  Discussed and agree with above.   Marchia Bond, MD Cell [phone]

## 2019-03-29 NOTE — Progress Notes (Signed)
Patient discharged to home w/ family. Given all belongings, instructions. Equipment at home. Verbalized understanding of instructions. Escorted to pov via w/c.

## 2019-03-29 NOTE — Discharge Summary (Signed)
Physician Discharge Summary  Patient ID: [name] MRN: [id number] DOB/AGE: 10-31-1970 48 y.o.  Admit date: 03/28/2019 Discharge date: 03/29/2019  Admission Diagnoses:  Osteoarthritis resulting from left hip dysplasia  Discharge Diagnoses:  Principal Problem:   Osteoarthritis resulting from left hip dysplasia Active Problems:   Status post total hip replacement, left   Past Medical History:  Diagnosis Date  . Allergies    has drainage from seasonal allergies  . Arthritis   . Dermatophytosis of nail   . Diabetes mellitus without complication ([place])    type 2  ,  does not monitor cbgs  at home    . Elevated blood pressure reading without diagnosis of hypertension   . Ganglion of tendon sheath   . Hypertension   . 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 Pain in joint, multiple sites   . PCOS (polycystic ovarian syndrome)   . Pure hypercholesterolemia   . Seasonal allergies   . Unspecified disorder of skin and subcutaneous tissue     Surgeries: Procedure(s): TOTAL HIP ARTHROPLASTY on 03/28/2019   Consultants (if any):   Discharged Condition: Improved  Hospital Course: Kathy Mccarthy is an 48 y.o. female who was admitted 03/28/2019 with a diagnosis of Osteoarthritis resulting from left hip dysplasia and went to the operating room on 03/28/2019 and underwent the above named procedures.    She was given perioperative antibiotics:  Anti-infectives (From admission, onward)   Start     Dose/Rate Route Frequency Ordered Stop   03/28/19 1600  ceFAZolin (ANCEF) IVPB 2g/100 mL premix     2 g 200 mL/hr over 30 Minutes Intravenous Every 6 hours 03/28/19 1440 03/28/19 2234   03/28/19 0600  ceFAZolin (ANCEF) 3 g in dextrose 5 % 50 mL IVPB     3 g 100 mL/hr over 30 Minutes Intravenous On call to O.R. 03/27/19 0747 03/28/19 1024    .  She was given sequential compression devices, early ambulation,  and aspirin for DVT prophylaxis.  She benefited maximally from the hospital stay and there were no complications.    Recent vital signs:  Vitals:   03/29/19 0044 03/29/19 0429  BP: 123/74 133/89  Pulse: 96 93  Resp: 18 18  Temp: 98.4 F (36.9 C) 98 F (36.7 C)  SpO2: 95% 94%    Recent laboratory studies:  Lab Results  Component Value Date   HGB 10.4 (L) 03/29/2019   HGB 13.8 03/20/2019   HGB 10.6 (L) 02/01/2019   Lab Results  Component Value Date   WBC 6.6 03/29/2019   PLT 151 03/29/2019   No results found for: INR Lab Results  Component Value Date   NA 136 03/29/2019   K 3.4 (L) 03/29/2019   CL 102 03/29/2019   CO2 19 (L) 03/29/2019   BUN 16 03/29/2019   CREATININE 0.73 03/29/2019   GLUCOSE 133 (H) 03/29/2019    Discharge Medications:   Allergies as of 03/29/2019   No Known Allergies     Medication List    TAKE these medications   acetaminophen 500 MG tablet Commonly known as: TYLENOL Take 1,500 mg by mouth daily as needed for moderate pain or headache.   aspirin EC 325 MG tablet Take 1 tablet (325 mg total) by mouth 2 (two) times daily.   atorvastatin 20 MG tablet Commonly known as: LIPITOR Take 1 tablet (20 mg total) by mouth daily.   CALCIUM + D PO  Take 1 tablet by mouth daily.   cyclobenzaprine 10 MG tablet Commonly known as: FLEXERIL TAKE 0.5-1 TABLETS (5-10 MG TOTAL) BY MOUTH AT BEDTIME.   diclofenac 75 MG EC tablet Commonly known as: VOLTAREN Take 1 tablet (75 mg total) by mouth 2 (two) times daily.   gabapentin 300 MG capsule Commonly known as: NEURONTIN TAKE 1 CAPSULE BY MOUTH EVERYDAY AT BEDTIME   GLUCOSAMINE-CHONDROITIN PO Take 2 tablets by mouth daily.   hydrochlorothiazide 25 MG tablet Commonly known as: HYDRODIURIL TAKE 1 TABLET BY MOUTH EVERY DAY WITH LOSARTAN   levonorgestrel 20 MCG/24HR IUD Commonly known as: MIRENA 1 each by Intrauterine route once.   losartan 100 MG tablet Commonly known as: COZAAR TAKE 1 TABLET  BY MOUTH EVERY DAY (TAKE WITH HCTZ)   metFORMIN 500 MG 24 hr tablet Commonly known as: GLUCOPHAGE-XR Take 1,000 mg by mouth daily with breakfast.   multivitamin with minerals tablet Take 1 tablet by mouth daily.   Nasacort Allergy 24HR 55 MCG/ACT Aero nasal inhaler Generic drug: triamcinolone Place 1 spray into the nose daily.   oxyCODONE 5 MG immediate release tablet Commonly known as: Roxicodone Take 1 tablet (5 mg total) by mouth every 4 (four) hours as needed for severe pain.   sennosides-docusate sodium 8.6-50 MG tablet Commonly known as: SENOKOT-S Take 2 tablets by mouth daily.       Diagnostic Studies: Dg Pelvis Portable  Result Date: 03/28/2019 CLINICAL DATA:  Patient status post left hip replacement today. Incorrect instrument count. EXAM: PORTABLE PELVIS 1-2 VIEWS COMPARISON:  Single-view of the pelvis 01/31/2019. FINDINGS: New left hip replacement is in place. Prior right hip replacement noted. No unexpected radiopaque foreign body is seen. Soft tissues peripheral to the left hip are off the margin the of the film. IMPRESSION: Negative for unexpected radiopaque foreign body.  No acute finding. Electronically Signed   By: Inge Rise M.D.   On: 03/28/2019 13:33   Dg Hip Port Unilat With Pelvis 1v Left  Result Date: 03/28/2019 CLINICAL DATA:  Status post left hip replacement EXAM: DG HIP (WITH OR WITHOUT PELVIS) 1V PORT LEFT COMPARISON:  None. FINDINGS: Left hip prosthesis is noted in satisfactory position. IUD is noted in place. No bony or soft tissue abnormality is noted. No radiopaque foreign body is seen. IMPRESSION: Status post hip replacement.  No radiopaque foreign body is noted. Electronically Signed   By: Inez Catalina M.D.   On: 03/28/2019 13:32   Dg Hip Port Unilat With Pelvis 1v Right  Result Date: 03/28/2019 CLINICAL DATA:  ? Fb, possible needle in left hip while still on OR table, instrument count EXAM: DG HIP (WITH OR WITHOUT PELVIS) 1V PORT RIGHT  COMPARISON:  None. FINDINGS: Single AP portable view of the left hip shows no evidence of a surgical needle or instrument. Left total hip arthroplasty appears well seated and aligned. IMPRESSION: No evidence of a retained surgical needle or instrument. Electronically Signed   By: Lajean Manes M.D.   On: 03/28/2019 12:58    Disposition: Discharge disposition: 01-Home or Self Care         Follow-up Information    Marchia Bond, MD. Schedule an appointment as soon as possible for a visit in 2 weeks.   Specialty: Orthopedic Surgery Contact information: [address] [place] [address] [phone]            Signed: Johnny Bridge 03/29/2019, 8:22 AM

## 2019-04-02 ENCOUNTER — Other Ambulatory Visit: Payer: Self-pay | Admitting: Family Medicine

## 2019-04-17 ENCOUNTER — Other Ambulatory Visit: Payer: Self-pay | Admitting: Family Medicine

## 2019-04-17 NOTE — Telephone Encounter (Signed)
Last office visit 03/24/2019 for DM.  Last refilled 01/20/2019 for #180 with no refills.  No future appointments.

## 2019-04-27 NOTE — Assessment & Plan Note (Signed)
Well controlled. Continue current medication.  

## 2019-04-27 NOTE — Assessment & Plan Note (Signed)
Well controlled. Continue current medication. Excellent control on metformin and with diet control. Encouraged exercise, weight loss, healthy eating habits.

## 2019-05-09 ENCOUNTER — Other Ambulatory Visit: Payer: Self-pay | Admitting: Family Medicine

## 2019-05-19 ENCOUNTER — Other Ambulatory Visit: Payer: Self-pay | Admitting: Family Medicine

## 2019-05-22 NOTE — Telephone Encounter (Signed)
Last office visit 03/24/2019 for DM.  Last refilled 03/17/2019 for #30 with 1 refill.  No future appointments.

## 2019-07-22 ENCOUNTER — Other Ambulatory Visit: Payer: Self-pay | Admitting: Family Medicine

## 2019-07-24 NOTE — Telephone Encounter (Signed)
Last office visit 03/24/2019 for DM.  Last refilled 05/23/2019 for #30 with 1 refill.  CPE scheduled for 09/12/2019.

## 2019-07-28 ENCOUNTER — Other Ambulatory Visit: Payer: Self-pay | Admitting: Family Medicine

## 2019-08-03 ENCOUNTER — Other Ambulatory Visit: Payer: Self-pay | Admitting: Family Medicine

## 2019-08-18 ENCOUNTER — Other Ambulatory Visit: Payer: Self-pay | Admitting: Family Medicine

## 2019-08-18 NOTE — Telephone Encounter (Signed)
Last office visit 03/24/2019 for DM.  Last refilled 04/18/2019 for #180 with no refills.  CPE schedule for 09/12/2019.

## 2019-08-28 LAB — HM DIABETES FOOT EXAM

## 2019-09-01 ENCOUNTER — Telehealth: Payer: Self-pay | Admitting: Family Medicine

## 2019-09-01 DIAGNOSIS — E7800 Pure hypercholesterolemia, unspecified: Secondary | ICD-10-CM

## 2019-09-01 DIAGNOSIS — E781 Pure hyperglyceridemia: Secondary | ICD-10-CM

## 2019-09-01 DIAGNOSIS — E119 Type 2 diabetes mellitus without complications: Secondary | ICD-10-CM

## 2019-09-01 NOTE — Telephone Encounter (Signed)
Patient  received her Covid vaccine on 08/27/19  [facility].

## 2019-09-01 NOTE — Telephone Encounter (Signed)
Immunization record updated.

## 2019-09-01 NOTE — Telephone Encounter (Signed)
-----   Message from Ellamae Sia sent at 08/24/2019  2:37 PM EST ----- Regarding: Lab orders for Monday, 3.15.21 Patient is scheduled for CPX labs, please order future labs, Thanks , Karna Christmas

## 2019-09-04 ENCOUNTER — Other Ambulatory Visit (INDEPENDENT_AMBULATORY_CARE_PROVIDER_SITE_OTHER): Payer: Managed Care, Other (non HMO)

## 2019-09-04 ENCOUNTER — Other Ambulatory Visit: Payer: Self-pay

## 2019-09-04 DIAGNOSIS — E7800 Pure hypercholesterolemia, unspecified: Secondary | ICD-10-CM

## 2019-09-04 DIAGNOSIS — E119 Type 2 diabetes mellitus without complications: Secondary | ICD-10-CM

## 2019-09-04 LAB — COMPREHENSIVE METABOLIC PANEL
ALT: 20 U/L (ref 0–35)
AST: 17 U/L (ref 0–37)
Albumin: 4.2 g/dL (ref 3.5–5.2)
Alkaline Phosphatase: 45 U/L (ref 39–117)
BUN: 21 mg/dL (ref 6–23)
CO2: 27 mEq/L (ref 19–32)
Calcium: 9.5 mg/dL (ref 8.4–10.5)
Chloride: 102 mEq/L (ref 96–112)
Creatinine, Ser: 0.89 mg/dL (ref 0.40–1.20)
GFR: 67.03 mL/min (ref >60.00)
Glucose, Bld: 121 mg/dL — ABNORMAL HIGH (ref 70–99)
Potassium: 3.8 mEq/L (ref 3.5–5.1)
Sodium: 138 mEq/L (ref 135–145)
Total Bilirubin: 1.6 mg/dL — ABNORMAL HIGH (ref 0.2–1.2)
Total Protein: 7.1 g/dL (ref 6.0–8.3)

## 2019-09-04 LAB — LIPID PANEL
Cholesterol: 226 mg/dL — ABNORMAL HIGH (ref 0–200)
HDL: 33.3 mg/dL — ABNORMAL LOW (ref >39.00)
Total CHOL/HDL Ratio: 7
Triglycerides: 532 mg/dL — ABNORMAL HIGH (ref 0.0–149.0)

## 2019-09-04 LAB — LDL CHOLESTEROL, DIRECT: Direct LDL: 96 mg/dL

## 2019-09-04 LAB — HEMOGLOBIN A1C: Hgb A1c MFr Bld: 6.6 % — ABNORMAL HIGH (ref 4.6–6.5)

## 2019-09-05 NOTE — Progress Notes (Signed)
No critical labs need to be addressed urgently. We will discuss labs in detail at upcoming office visit.   

## 2019-09-12 ENCOUNTER — Ambulatory Visit (INDEPENDENT_AMBULATORY_CARE_PROVIDER_SITE_OTHER): Payer: Managed Care, Other (non HMO) | Admitting: Family Medicine

## 2019-09-12 ENCOUNTER — Other Ambulatory Visit: Payer: Self-pay

## 2019-09-12 ENCOUNTER — Encounter: Payer: Self-pay | Admitting: Family Medicine

## 2019-09-12 VITALS — BP 140/90 | HR 105 | Temp 98.3°F | Ht 68.5 in | Wt 266.5 lb

## 2019-09-12 DIAGNOSIS — Z0000 Encounter for general adult medical examination without abnormal findings: Secondary | ICD-10-CM

## 2019-09-12 DIAGNOSIS — E119 Type 2 diabetes mellitus without complications: Secondary | ICD-10-CM

## 2019-09-12 DIAGNOSIS — E7800 Pure hypercholesterolemia, unspecified: Secondary | ICD-10-CM

## 2019-09-12 DIAGNOSIS — T7840XA Allergy, unspecified, initial encounter: Secondary | ICD-10-CM

## 2019-09-12 DIAGNOSIS — I10 Essential (primary) hypertension: Secondary | ICD-10-CM | POA: Diagnosis not present

## 2019-09-12 DIAGNOSIS — Z1211 Encounter for screening for malignant neoplasm of colon: Secondary | ICD-10-CM

## 2019-09-12 DIAGNOSIS — R21 Rash and other nonspecific skin eruption: Secondary | ICD-10-CM | POA: Insufficient documentation

## 2019-09-12 DIAGNOSIS — Z6841 Body Mass Index (BMI) 40.0 and over, adult: Secondary | ICD-10-CM

## 2019-09-12 MED ORDER — ATORVASTATIN CALCIUM 40 MG PO TABS: 20.0000 mg | ORAL_TABLET | Freq: Every day | ORAL | 5 refills | Status: DC

## 2019-09-12 MED ORDER — DOXYCYCLINE HYCLATE 100 MG PO TABS: 100.0000 mg | ORAL_TABLET | Freq: Two times a day (BID) | ORAL | 0 refills | Status: DC

## 2019-09-12 NOTE — Assessment & Plan Note (Signed)
Given MRSA posititve pre-op.Marland Kitchen this may be impetigo.. treat with doxy. If not resolving.. consider yeast vs. Allergic reaction.

## 2019-09-12 NOTE — Patient Instructions (Addendum)
Consulting office will call to set up referral in several weeks.  Can use zyrtec and increase to 2 sprays daily of nasal steroid spray. Complete antibiotics.Marland Kitchen if rash not improving follow up.

## 2019-09-12 NOTE — Assessment & Plan Note (Signed)
Treat with increased nasal steroid and start zyrtec.

## 2019-09-12 NOTE — Assessment & Plan Note (Signed)
Encouraged exercise, weight loss, healthy eating habits. ? ?

## 2019-09-12 NOTE — Assessment & Plan Note (Signed)
Well controlled. Continue current medication.  

## 2019-09-12 NOTE — Assessment & Plan Note (Signed)
Worsened control on metformin but good work on weight loss. Re-eval in 3 months.

## 2019-09-12 NOTE — Assessment & Plan Note (Signed)
Increase atorvastatin to 40 mg daily. Continue weight loss and lifestyle change.. re-eval in 3 motnhs.

## 2019-09-12 NOTE — Progress Notes (Signed)
Chief Complaint  Patient presents with  . Annual Exam    History of Present Illness: HPI The patient is here for annual wellness exam and preventative care.    Diabetes:  Worsened control but  < 7 A1C on metformin Lab Results  Component Value Date   HGBA1C 6.6 (H) 09/04/2019  Using medications without difficulties: Hypoglycemic episodes: Hyperglycemic episodes: Feet problems: no ulcers Blood Sugars averaging: not checking eye exam within last year: yes  Hypertension:   Good control at home on losartan HCTZ BP Readings from Last 3 Encounters:  09/12/19 140/90  03/29/19 (!) 144/90  03/20/19 (!) 139/98   Using medication without problems or lightheadedness: none Chest pain with exertion:none Edema:none Short of breath:none Average home BPs: 130/70 Other issues:  Elevated Cholesterol:  LDL at goal on atorvastatin, trigs remain high Lab Results  Component Value Date   CHOL 226 (H) 09/04/2019   HDL 33.30 (L) 09/04/2019   LDLDIRECT 96.0 09/04/2019   TRIG (H) 09/04/2019    532.0 Triglyceride is over 400; calculations on Lipids are invalid.   CHOLHDL 7 09/04/2019  Using medications without problems: Muscle aches:  Diet compliance: good.. low carb diet. Exercise: walking Other complaints: Morbid obesity  Body mass index is 39.93 kg/m.   01/2019 S/P THR right 03/2019 S/P THR left She is walking without pain  Wt Readings from Last 3 Encounters:  09/12/19 266 lb 8 oz (120.9 kg)  03/28/19 270 lb (122.5 kg)  03/24/19 270 lb (122.5 kg)   Red rash at lips.. crusty, dry skin on lips, not itchy..feel it is due to mask. Changes daily.treated with aquaphor. 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    This visit occurred during the SARS-CoV-2 public health emergency.  Safety protocols were in place, including screening questions prior to the visit, additional usage of staff PPE, and extensive cleaning of exam room while observing appropriate contact  time as indicated for disinfecting solutions.   COVID 19 screen:  No recent travel or known exposure to COVID19 The patient denies respiratory symptoms of COVID 19 at this time. The importance of social distancing was discussed today.     ROS    Past Medical History:  Diagnosis Date  . Allergies    has drainage from seasonal allergies  . Arthritis   . Dermatophytosis of nail   . Diabetes mellitus without complication ([place])    type 2  ,  does not monitor cbgs  at home    . Elevated blood pressure reading without diagnosis of hypertension   . Ganglion of tendon sheath   . Hypertension   . 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 Pain in joint, multiple sites   . PCOS (polycystic ovarian syndrome)   . Pure hypercholesterolemia   . Seasonal allergies   . Unspecified disorder of skin and subcutaneous tissue     reports that she has never smoked. She has never used smokeless tobacco. She reports that she does not drink alcohol or use drugs.   Current Outpatient Medications:  .  acetaminophen (TYLENOL) 500 MG tablet, Take 1,500 mg by mouth daily as needed for moderate pain or headache., Disp: , Rfl:  .  atorvastatin (LIPITOR) 20 MG tablet, Take 1 tablet (20 mg total) by mouth daily., Disp: 90 tablet, Rfl: 3 .  Calcium Citrate-Vitamin D (CALCIUM + D PO), Take 1 tablet by mouth daily., Disp: , Rfl:  .  cyclobenzaprine (FLEXERIL) 10 MG tablet, TAKE 0.5-1 TABLETS (5-10 MG TOTAL) BY MOUTH AT BEDTIME., Disp: 30 tablet, Rfl: 1 .  diclofenac (VOLTAREN) 75 MG EC tablet, TAKE 1 TABLET BY MOUTH TWICE A DAY, Disp: 180 tablet, Rfl: 0 .  GLUCOSAMINE-CHONDROITIN PO, Take 2 tablets by mouth daily., Disp: , Rfl:  .  hydrochlorothiazide (HYDRODIURIL) 25 MG tablet, TAKE 1 TABLET BY MOUTH EVERY DAY WITH LOSARTAN, Disp: 90 tablet, Rfl: 1 .  levonorgestrel (MIRENA) 20 MCG/24HR IUD, 1 each by Intrauterine route once., Disp:  , Rfl:  .  losartan (COZAAR) 100 MG tablet, TAKE 1 TABLET BY MOUTH EVERY DAY (TAKE WITH HCTZ), Disp: 90 tablet, Rfl: 1 .  metFORMIN (GLUCOPHAGE-XR) 500 MG 24 hr tablet, Take 2 tablets (1,000 mg total) by mouth daily with breakfast., Disp: 180 tablet, Rfl: 1 .  Multiple Vitamins-Minerals (MULTIVITAMIN WITH MINERALS) tablet, Take 1 tablet by mouth daily., Disp: , Rfl:  .  triamcinolone (NASACORT ALLERGY 24HR) 55 MCG/ACT AERO nasal inhaler, Place 1 spray into the nose daily. , Disp: , Rfl:    Observations/Objective: Blood pressure 140/90, pulse (!) 105, temperature 98.3 F (36.8 C), temperature source Temporal, height 5' 8.5" (1.74 m), weight 266 lb 8 oz (120.9 kg), SpO2 95 %.  Physical Exam Constitutional:      General: She is not in acute distress.    Appearance: Normal appearance. She is well-developed. She is not ill-appearing or toxic-appearing.  HENT:     Head: Normocephalic.     Right Ear: Hearing, tympanic membrane, ear canal and external ear normal.     Left Ear: Hearing, tympanic membrane, ear canal and external ear normal.     Nose: Nose normal.     Mouth/Throat:      Comments: Erythema at lip edges and surrounding mouth, lips are irritated.. well moisturized at the moment. Eyes:     General: Lids are normal. Lids are everted, no foreign bodies appreciated.     Conjunctiva/sclera: Conjunctivae normal.     Pupils: Pupils are equal, round, and reactive to light.  Neck:     Thyroid: No thyroid mass or thyromegaly.     Vascular: No carotid bruit.     Trachea: Trachea normal.  Cardiovascular:     Rate and Rhythm: Normal rate and regular rhythm.     Heart sounds: Normal heart sounds, S1 normal and S2 normal. No murmur. No gallop.   Pulmonary:     Effort: Pulmonary effort is normal. No respiratory distress.     Breath sounds: Normal breath sounds. No wheezing, rhonchi or rales.  Abdominal:     General: Bowel sounds are normal. There is no distension or abdominal bruit.      Palpations: Abdomen is soft. There is no fluid wave or mass.     Tenderness: There is no abdominal tenderness. There is no guarding or rebound.     Hernia: No hernia is present.  Musculoskeletal:     Cervical back: Normal range of motion and neck supple.  Lymphadenopathy:     Cervical: No cervical adenopathy.  Skin:    General: Skin is warm and dry.     Findings: No rash.  Neurological:     Mental Status: She is alert.     Cranial Nerves: No cranial nerve deficit.     Sensory: No sensory deficit.  Psychiatric:        Mood and Affect: Mood is not anxious or depressed.        Speech: Speech normal.  Behavior: Behavior normal. Behavior is cooperative.        Judgment: Judgment normal.       redness around mouth , thickend dry skin. No crust.  Diabetic foot exam: Normal inspection No skin breakdown No calluses  Normal DP pulses Normal sensation to light touch and monofilament Nails normal  Assessment and Plan   The patient's preventative maintenance and recommended screening tests for an annual wellness exam were reviewed in full today. Brought up to date unless services declined.  Counselled on the importance of diet, exercise, and its role in overall health and mortality. The patient's FH and SH was reviewed, including their home life, tobacco status, and drug and alcohol status.   Vaccines: Due for  TDAP, PNA given DM history... COVID 19 In MARCH Pap/DVE:  Sees GYN next OV in 10/2018 Mammo:  Schedule in 10/2019.. AUNT WITH BReAST CANCER Colon:  Plan colonoscopy Smoking Status:nonsmoker ETOH/ drug OS:[id number]  HIV screen:   refused  Morbid obesity with BMI of 40.0-44.9, adult ([place]) Encouraged exercise, weight loss, healthy eating habits.   HYPERCHOLESTEROLEMIA, PURE Increase atorvastatin to 40 mg daily. Continue weight loss and lifestyle change.. re-eval in 3 motnhs.  HTN (hypertension) Well controlled. Continue current medication.   Diabetes mellitus  with no complication  Worsened control on metformin but good work on weight loss. Re-eval in 3 months.  Rash of mouth present on examination Given MRSA posititve pre-op.Marland Kitchen this may be impetigo.. treat with doxy. If not resolving.. consider yeast vs. Allergic reaction.  Allergies Treat with increased nasal steroid and start zyrtec.    Eliezer Lofts, MD

## 2019-09-18 ENCOUNTER — Ambulatory Visit (INDEPENDENT_AMBULATORY_CARE_PROVIDER_SITE_OTHER): Payer: Self-pay | Admitting: Gastroenterology

## 2019-09-18 VITALS — Ht 69.0 in | Wt 260.0 lb

## 2019-09-18 DIAGNOSIS — Z1211 Encounter for screening for malignant neoplasm of colon: Secondary | ICD-10-CM

## 2019-09-18 MED ORDER — NA SULFATE-K SULFATE-MG SULF 17.5-3.13-1.6 GM/177ML PO SOLN: 1.0000 | Freq: Once | ORAL | 0 refills | Status: AC

## 2019-09-18 NOTE — Progress Notes (Signed)
Gastroenterology Pre-Procedure Review  Request Date: Friday 12/08/19 Requesting Physician: Dr. Vicente Males  PATIENT REVIEW QUESTIONS: The patient responded to the following health history questions as indicated:    1. Are you having any GI issues? no 2. Do you have a personal history of Polyps? no 3. Do you have a family history of Colon Cancer or Polyps? unsure adopted 4. Diabetes Mellitus? yes 5. 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 Major health problems in the past 3 months?no 7. Any artificial heart valves, MVP, or defibrillator?no    MEDICATIONS & ALLERGIES:    Patient reports the following regarding taking any anticoagulation/antiplatelet therapy:   Plavix, Coumadin, Eliquis, Xarelto, Lovenox, Pradaxa, Brilinta, or Effient? no Aspirin? no  Patient confirms/reports the following medications:  Current Outpatient Medications  Medication Sig Dispense Refill  . acetaminophen (TYLENOL) 500 MG tablet Take 1,500 mg by mouth daily as needed for moderate pain or headache.    Marland Kitchen atorvastatin (LIPITOR) 40 MG tablet Take 0.5 tablets (20 mg total) by mouth daily. 30 tablet 5  . Calcium Citrate-Vitamin D (CALCIUM + D PO) Take 1 tablet by mouth daily.    . cyclobenzaprine (FLEXERIL) 10 MG tablet TAKE 0.5-1 TABLETS (5-10 MG TOTAL) BY MOUTH AT BEDTIME. 30 tablet 1  . diclofenac (VOLTAREN) 75 MG EC tablet TAKE 1 TABLET BY MOUTH TWICE A DAY 180 tablet 0  . doxycycline (VIBRA-TABS) 100 MG tablet Take 1 tablet (100 mg total) by mouth 2 (two) times daily. 20 tablet 0  . GLUCOSAMINE-CHONDROITIN PO Take 2 tablets by mouth daily.    . hydrochlorothiazide (HYDRODIURIL) 25 MG tablet TAKE 1 TABLET BY MOUTH EVERY DAY WITH LOSARTAN 90 tablet 1  . levonorgestrel (MIRENA) 20 MCG/24HR IUD 1 each by Intrauterine route once.    Marland Kitchen losartan (COZAAR) 100 MG tablet TAKE 1 TABLET BY MOUTH EVERY DAY (TAKE WITH HCTZ) 90 tablet 1  . metFORMIN (GLUCOPHAGE-XR)  500 MG 24 hr tablet Take 2 tablets (1,000 mg total) by mouth daily with breakfast. 180 tablet 1  . Multiple Vitamins-Minerals (MULTIVITAMIN WITH MINERALS) tablet Take 1 tablet by mouth daily.    Marland Kitchen triamcinolone (NASACORT ALLERGY 24HR) 55 MCG/ACT AERO nasal inhaler Place 1 spray into the nose daily.      No current facility-administered medications for this visit.    Patient confirms/reports the following allergies:  No Known Allergies  No orders of the defined types were placed in this encounter.   AUTHORIZATION INFORMATION Primary Insurance: 1D#: Group #:  Secondary Insurance: 1D#: Group #:  SCHEDULE INFORMATION: Date: Friday 12/08/19 Time: Location:[facility]

## 2019-09-25 ENCOUNTER — Other Ambulatory Visit: Payer: Self-pay | Admitting: Family Medicine

## 2019-09-25 NOTE — Telephone Encounter (Signed)
Last office visit 09/12/2019 for CPE.  Last refilled 07/24/2019 for #30 with 1 refill.  Next Appt: 12/19/2019 for 3 month follow up.

## 2019-10-03 ENCOUNTER — Telehealth: Payer: Self-pay

## 2019-10-03 NOTE — Telephone Encounter (Signed)
Pt left v/m; pt had annual visit on 09/12/19 and pt started abx for rash around mouth; when pt finished abx rash went away but has come back again and pt wants to know what to do next.

## 2019-10-04 NOTE — Telephone Encounter (Signed)
Have her make appt for re-eval... we can try to send bacterial culture given possible resistant bacteria OR if she prefers I can refer to Derm.

## 2019-10-04 NOTE — Telephone Encounter (Signed)
Left message with Kathy Mccarthy with message below from Dr. Diona Browner.  I ask that she call the office back to either schedule appointment with Dr. Diona Browner or to let us know she wants a referral to dermatology.

## 2019-10-08 ENCOUNTER — Other Ambulatory Visit: Payer: Self-pay | Admitting: Family Medicine

## 2019-10-17 ENCOUNTER — Ambulatory Visit (INDEPENDENT_AMBULATORY_CARE_PROVIDER_SITE_OTHER): Payer: Managed Care, Other (non HMO)

## 2019-10-17 ENCOUNTER — Other Ambulatory Visit: Payer: Self-pay

## 2019-10-17 DIAGNOSIS — Z23 Encounter for immunization: Secondary | ICD-10-CM

## 2019-11-07 ENCOUNTER — Other Ambulatory Visit: Payer: Self-pay | Admitting: Family Medicine

## 2019-11-11 ENCOUNTER — Other Ambulatory Visit: Payer: Self-pay | Admitting: Family Medicine

## 2019-11-13 NOTE — Telephone Encounter (Signed)
Last office visit 09/12/2019 for CPE. Last refilled 08/18/2019 for #180 with no refills.  Next Appt: 12/19/2019 for 3 month follow up.

## 2019-11-15 LAB — HM MAMMOGRAPHY

## 2019-11-15 LAB — HM PAP SMEAR

## 2019-11-15 LAB — RESULTS CONSOLE HPV: CHL HPV: NEGATIVE

## 2019-11-29 ENCOUNTER — Other Ambulatory Visit: Payer: Self-pay | Admitting: Family Medicine

## 2019-11-29 NOTE — Telephone Encounter (Signed)
Last office visit 09/12/2019 for CPE.  Last refilled 09/25/2019 for #30 with 1 refill.  Next Appt: 12/19/19 for 3 month follow up.

## 2019-12-06 ENCOUNTER — Other Ambulatory Visit: Payer: Managed Care, Other (non HMO)

## 2019-12-08 ENCOUNTER — Telehealth: Payer: Self-pay | Admitting: Family Medicine

## 2019-12-08 ENCOUNTER — Encounter: Admission: RE | Payer: Self-pay | Source: Home / Self Care

## 2019-12-08 ENCOUNTER — Ambulatory Visit
Admission: RE | Admit: 2019-12-08 | Payer: Managed Care, Other (non HMO) | Source: Home / Self Care | Admitting: Gastroenterology

## 2019-12-08 DIAGNOSIS — E119 Type 2 diabetes mellitus without complications: Secondary | ICD-10-CM

## 2019-12-08 SURGERY — COLONOSCOPY WITH PROPOFOL
Anesthesia: General

## 2019-12-08 NOTE — Telephone Encounter (Signed)
-----   Message from Cloyd Stagers, RT sent at 11/29/2019  2:37 PM EDT ----- Regarding: Lab Orders for Tuesday 6.22.2021 Please place lab orders for Tuesday 6.22.2021, appt notes state "fasting labs" Thank you, Dyke Maes RT(R)

## 2019-12-12 ENCOUNTER — Other Ambulatory Visit: Payer: Managed Care, Other (non HMO)

## 2019-12-19 ENCOUNTER — Ambulatory Visit: Payer: Managed Care, Other (non HMO) | Admitting: Family Medicine

## 2020-01-01 LAB — HM DIABETES EYE EXAM

## 2020-01-02 ENCOUNTER — Encounter: Payer: Self-pay | Admitting: Family Medicine

## 2020-01-18 ENCOUNTER — Other Ambulatory Visit: Payer: Self-pay | Admitting: Family Medicine

## 2020-01-28 ENCOUNTER — Other Ambulatory Visit: Payer: Self-pay | Admitting: Family Medicine

## 2020-01-29 LAB — HM DIABETES FOOT EXAM

## 2020-01-29 NOTE — Telephone Encounter (Signed)
Last office visit 09/12/2019 for CPE.  Last refilled 11/29/2019 for #30 with 1 refill.  Next Appt: 08/202/021 for DM.

## 2020-02-02 ENCOUNTER — Other Ambulatory Visit: Payer: Managed Care, Other (non HMO)

## 2020-02-06 ENCOUNTER — Other Ambulatory Visit: Payer: Self-pay | Admitting: *Deleted

## 2020-02-06 MED ORDER — DICLOFENAC SODIUM 75 MG PO TBEC: 75.0000 mg | DELAYED_RELEASE_TABLET | Freq: Two times a day (BID) | ORAL | 0 refills | Status: DC

## 2020-02-06 NOTE — Telephone Encounter (Signed)
Last office visit 09/12/2019 for CPE.   Last refilled 11/14/2019 for #180 with no refills.  Next Appt: 02/09/2020 for DM.

## 2020-02-09 ENCOUNTER — Ambulatory Visit: Payer: Managed Care, Other (non HMO) | Admitting: Family Medicine

## 2020-02-13 ENCOUNTER — Other Ambulatory Visit: Payer: Self-pay

## 2020-02-13 ENCOUNTER — Other Ambulatory Visit (INDEPENDENT_AMBULATORY_CARE_PROVIDER_SITE_OTHER): Payer: Managed Care, Other (non HMO)

## 2020-02-13 DIAGNOSIS — E119 Type 2 diabetes mellitus without complications: Secondary | ICD-10-CM

## 2020-02-13 LAB — LIPID PANEL
Cholesterol: 189 mg/dL (ref 0–200)
HDL: 33.7 mg/dL — ABNORMAL LOW (ref >39.00)
Total CHOL/HDL Ratio: 6
Triglycerides: 410 mg/dL — ABNORMAL HIGH (ref 0.0–149.0)

## 2020-02-13 LAB — COMPREHENSIVE METABOLIC PANEL
ALT: 39 U/L — ABNORMAL HIGH (ref 0–35)
AST: 27 U/L (ref 0–37)
Albumin: 4 g/dL (ref 3.5–5.2)
Alkaline Phosphatase: 39 U/L (ref 39–117)
BUN: 17 mg/dL (ref 6–23)
CO2: 28 mEq/L (ref 19–32)
Calcium: 9.1 mg/dL (ref 8.4–10.5)
Chloride: 102 mEq/L (ref 96–112)
Creatinine, Ser: 0.84 mg/dL (ref 0.40–1.20)
GFR: 71.53 mL/min (ref >60.00)
Glucose, Bld: 126 mg/dL — ABNORMAL HIGH (ref 70–99)
Potassium: 4 mEq/L (ref 3.5–5.1)
Sodium: 138 mEq/L (ref 135–145)
Total Bilirubin: 1.1 mg/dL (ref 0.2–1.2)
Total Protein: 6.8 g/dL (ref 6.0–8.3)

## 2020-02-13 LAB — HEMOGLOBIN A1C: Hgb A1c MFr Bld: 6.9 % — ABNORMAL HIGH (ref 4.6–6.5)

## 2020-02-13 LAB — LDL CHOLESTEROL, DIRECT: Direct LDL: 93 mg/dL

## 2020-02-13 NOTE — Progress Notes (Signed)
No critical labs need to be addressed urgently. We will discuss labs in detail at upcoming office visit.   

## 2020-02-20 ENCOUNTER — Other Ambulatory Visit: Payer: Self-pay

## 2020-02-20 ENCOUNTER — Encounter: Payer: Self-pay | Admitting: Family Medicine

## 2020-02-20 ENCOUNTER — Ambulatory Visit: Payer: Managed Care, Other (non HMO) | Admitting: Family Medicine

## 2020-02-20 VITALS — BP 144/92 | HR 88 | Temp 97.9°F | Ht 68.5 in | Wt 275.0 lb

## 2020-02-20 DIAGNOSIS — Z1211 Encounter for screening for malignant neoplasm of colon: Secondary | ICD-10-CM | POA: Diagnosis not present

## 2020-02-20 DIAGNOSIS — R7989 Other specified abnormal findings of blood chemistry: Secondary | ICD-10-CM

## 2020-02-20 DIAGNOSIS — Z23 Encounter for immunization: Secondary | ICD-10-CM | POA: Diagnosis not present

## 2020-02-20 DIAGNOSIS — I10 Essential (primary) hypertension: Secondary | ICD-10-CM

## 2020-02-20 DIAGNOSIS — Z6841 Body Mass Index (BMI) 40.0 and over, adult: Secondary | ICD-10-CM

## 2020-02-20 DIAGNOSIS — E785 Hyperlipidemia, unspecified: Secondary | ICD-10-CM

## 2020-02-20 DIAGNOSIS — E1169 Type 2 diabetes mellitus with other specified complication: Secondary | ICD-10-CM | POA: Diagnosis not present

## 2020-02-20 DIAGNOSIS — E1159 Type 2 diabetes mellitus with other circulatory complications: Secondary | ICD-10-CM

## 2020-02-20 NOTE — Assessment & Plan Note (Signed)
Slight increase but may be due to increase in statin.

## 2020-02-20 NOTE — Assessment & Plan Note (Signed)
Encouraged exercise, weight loss, healthy eating habits. ? ?

## 2020-02-20 NOTE — Addendum Note (Signed)
Addended by: Carter Kitten on: 02/20/2020 03:10 PM   Modules accepted: Orders

## 2020-02-20 NOTE — Patient Instructions (Addendum)
Follow BP at home.Marland Kitchen goal < 140/90.Marland Kitchen call if running higher.  Work on healthy eating and increase exercise.  Stop at lab on way out for stool cards.

## 2020-02-20 NOTE — Progress Notes (Signed)
Chief Complaint  Patient presents with  . Diabetes    f/u    History of Present Illness: HPI    49 year old female presents for 6 month DM follow up.   She is feeling well overall.   Diabetes: Remains at [place] < 7 A1C on metformin. Lab Results  Component Value Date   HGBA1C 6.9 (H) 02/13/2020  Using medications without difficulties: Hypoglycemic episodes: Hyperglycemic episodes: Feet problems: no ulcers Blood Sugars averaging: not chekcing eye exam within last year:  Hypertension:   Not at goal in office today despite losartan/HCTZ. Controlled at home. BP Readings from Last 3 Encounters:  02/20/20 (!) 144/92  09/12/19 140/90  03/29/19 (!) 144/90  Using medication without problems or lightheadedness: none Chest pain with exertion:none Edema:none Short of breath:none Average home BPs: 130/80s Other issues:  Elevated Cholesterol: At last OV increase atorvastatin to 40 mg daily trigs better now < 500! Lab Results  Component Value Date   CHOL 189 02/13/2020   HDL 33.70 (L) 02/13/2020   LDLDIRECT 93.0 02/13/2020   TRIG (H) 02/13/2020    410.0 Triglyceride is over 400; calculations on Lipids are invalid.   CHOLHDL 6 02/13/2020   Using medications without problems: Muscle aches:  Diet compliance: oo in last month.. more red meat. Exercise: She is walking 4 times a week in gyn Other complaints:    Wt Readings from Last 3 Encounters:  02/20/20 275 lb (124.7 kg)  09/18/19 260 lb (117.9 kg)  09/12/19 266 lb 8 oz (120.9 kg)        This visit occurred during the SARS-CoV-2 public health emergency.  Safety protocols were in place, including screening questions prior to the visit, additional usage of staff PPE, and extensive cleaning of exam room while observing appropriate contact time as indicated for disinfecting solutions.   COVID 19 screen:  No recent travel or known exposure to COVID19 The patient denies respiratory symptoms of COVID 19 at this time. The  importance of social distancing was discussed today.     ROS    Past Medical History:  Diagnosis Date  . Allergies    has drainage from seasonal allergies  . Arthritis   . Dermatophytosis of nail   . Diabetes mellitus without complication ([place])    type 2  ,  does not monitor cbgs  at home    . Elevated blood pressure reading without diagnosis of hypertension   . Ganglion of tendon sheath   . Hypertension   . 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 Pain in joint, multiple sites   . PCOS (polycystic ovarian syndrome)   . Pure hypercholesterolemia   . Seasonal allergies   . Unspecified disorder of skin and subcutaneous tissue     reports that she has never smoked. She has never used smokeless tobacco. She reports that she does not drink alcohol and does not use drugs.   Current Outpatient Medications:  .  atorvastatin (LIPITOR) 40 MG tablet, Take 0.5 tablets (20 mg total) by mouth daily., Disp: 30 tablet, Rfl: 5 .  Calcium Citrate-Vitamin D (CALCIUM + D PO), Take 1 tablet by mouth daily., Disp: , Rfl:  .  cyclobenzaprine (FLEXERIL) 10 MG tablet, TAKE 0.5-1 TABLETS (5-10 MG TOTAL) BY MOUTH AT BEDTIME., Disp: 30 tablet, Rfl: 1 .  diclofenac (VOLTAREN) 75 MG EC tablet, Take 1 tablet (75 mg total) by mouth 2 (two) times daily., Disp: 180 tablet,  Rfl: 0 .  GLUCOSAMINE-CHONDROITIN PO, Take 2 tablets by mouth daily., Disp: , Rfl:  .  hydrochlorothiazide (HYDRODIURIL) 25 MG tablet, TAKE 1 TABLET BY MOUTH EVERY DAY WITH LOSARTAN, Disp: 90 tablet, Rfl: 1 .  levonorgestrel (MIRENA) 20 MCG/24HR IUD, 1 each by Intrauterine route once., Disp: , Rfl:  .  losartan (COZAAR) 100 MG tablet, TAKE 1 TABLET BY MOUTH EVERY DAY (TAKE WITH HCTZ), Disp: 90 tablet, Rfl: 1 .  metFORMIN (GLUCOPHAGE-XR) 500 MG 24 hr tablet, TAKE 2 TABLETS BY MOUTH EVERY DAY WITH BREAKFAST, Disp: 180 tablet, Rfl: 1 .  Multiple Vitamins-Minerals  (MULTIVITAMIN WITH MINERALS) tablet, Take 1 tablet by mouth daily., Disp: , Rfl:  .  triamcinolone (NASACORT ALLERGY 24HR) 55 MCG/ACT AERO nasal inhaler, Place 1 spray into the nose daily. , Disp: , Rfl:    Observations/Objective: Blood pressure (!) 144/92, pulse 88, temperature 97.9 F (36.6 C), temperature source Temporal, height 5' 8.5" (1.74 m), weight 275 lb (124.7 kg), SpO2 97 %.  Physical Exam Constitutional:      General: She is not in acute distress.    Appearance: Normal appearance. She is well-developed. She is obese. She is not ill-appearing or toxic-appearing.  HENT:     Head: Normocephalic.     Right Ear: Hearing, tympanic membrane, ear canal and external ear normal. Tympanic membrane is not erythematous, retracted or bulging.     Left Ear: Hearing, tympanic membrane, ear canal and external ear normal. Tympanic membrane is not erythematous, retracted or bulging.     Nose: No mucosal edema or rhinorrhea.     Right Sinus: No maxillary sinus tenderness or frontal sinus tenderness.     Left Sinus: No maxillary sinus tenderness or frontal sinus tenderness.     Mouth/Throat:     Pharynx: Uvula midline.  Eyes:     General: Lids are normal. Lids are everted, no foreign bodies appreciated.     Conjunctiva/sclera: Conjunctivae normal.     Pupils: Pupils are equal, round, and reactive to light.  Neck:     Thyroid: No thyroid mass or thyromegaly.     Vascular: No carotid bruit.     Trachea: Trachea normal.  Cardiovascular:     Rate and Rhythm: Normal rate and regular rhythm.     Pulses: Normal pulses.     Heart sounds: Normal heart sounds, S1 normal and S2 normal. No murmur heard.  No friction rub. No gallop.   Pulmonary:     Effort: Pulmonary effort is normal. No tachypnea or respiratory distress.     Breath sounds: Normal breath sounds. No decreased breath sounds, wheezing, rhonchi or rales.  Abdominal:     General: Bowel sounds are normal.     Palpations: Abdomen is soft.      Tenderness: There is no abdominal tenderness.  Musculoskeletal:     Cervical back: Normal range of motion and neck supple.  Skin:    General: Skin is warm and dry.     Findings: No rash.  Neurological:     Mental Status: She is alert.  Psychiatric:        Mood and Affect: Mood is not anxious or depressed.        Speech: Speech normal.        Behavior: Behavior normal. Behavior is cooperative.        Thought Content: Thought content normal.        Judgment: Judgment normal.      Diabetic foot exam: Normal inspection No skin  breakdown No calluses  Normal DP pulses Normal sensation to light touch and monofilament Nails normal  Assessment and Plan Type 2 diabetes mellitus with other circulatory complications: HTN ([place]) Well controlled. Continue current medication. Encouraged exercise, weight loss, healthy eating habits.   Hyperlipidemia associated with type 2 diabetes mellitus ([facility]) LDL at goal on statin... LDL improving.Marland Kitchen get back on low animal fat diet.  Morbid obesity with BMI of 40.0-44.9, adult ([employer]) Encouraged exercise, weight loss, healthy eating habits.   Elevated liver function tests Slight increase but may be due to increase in statin.  Hypertension associated with diabetes ([place])  Good control at home on current ARB/HCTZ regimen.. follow.       Eliezer Lofts, MD

## 2020-02-20 NOTE — Assessment & Plan Note (Signed)
LDL at goal on statin... LDL improving.Marland Kitchen get back on low animal fat diet.

## 2020-02-20 NOTE — Assessment & Plan Note (Signed)
Good control at home on current ARB/HCTZ regimen.. follow.

## 2020-02-20 NOTE — Assessment & Plan Note (Signed)
Well controlled. Continue current medication. Encouraged exercise, weight loss, healthy eating habits.  

## 2020-02-28 ENCOUNTER — Telehealth: Payer: Self-pay

## 2020-02-28 ENCOUNTER — Other Ambulatory Visit: Payer: Self-pay

## 2020-02-28 ENCOUNTER — Ambulatory Visit: Payer: Managed Care, Other (non HMO) | Admitting: Family Medicine

## 2020-02-28 ENCOUNTER — Encounter: Payer: Self-pay | Admitting: Family Medicine

## 2020-02-28 VITALS — BP 142/100 | HR 101 | Temp 97.0°F | Ht 68.5 in | Wt 266.8 lb

## 2020-02-28 DIAGNOSIS — B029 Zoster without complications: Secondary | ICD-10-CM

## 2020-02-28 NOTE — Telephone Encounter (Signed)
Pt said she had shingles vaccine on 02/20/20; on 02/22/20 pt started with rash on lt side of back; now the rash is little larger than a golf ball;rash is red with blister like places; lt side has numb area just above the waistline that goes back to rash on back. Pt said she is not in a lot of pain but is having itching; pt said has not spread but pt said now looks like rash is going from back to the waistline area on lt side.  Pt has no covid symptoms, no travel and no known exposure to + covid. Pt had J&J covid vaccine on 08/27/19. Pt said she understood that the sooner the treatment is begun the better. Pt said she waited until today to call due to the labor day holiday. Pt request cb after reviewed. No available appts at [facility] on 02/28/20 or 09/096/21. Sending to Dr Diona Browner and Butch Penny CMA.

## 2020-02-28 NOTE — Telephone Encounter (Signed)
Dr Lorelei Pont will see pt this afternoon at 3:40. Pt scheduled appt and will be here at 3:20 for ck in.

## 2020-02-28 NOTE — Progress Notes (Signed)
    Tywon Niday T. Rehmat Murtagh, MD, [place] at [facility] [place], [address]  Phone: [phone]  FAX: [phone]  CONCEPCION GILLOTT - 49 y.o. female  MRN [id number]  Date of Birth: December 25, 1970  Date: 02/28/2020  PCP: Jinny Sanders, MD  Referral: Jinny Sanders, MD  Chief Complaint  Patient presents with  . Rash    pt received shingrix vaccine 02/19/2020 and on 02/23/2020 noticed rash on LLQ and lower back    This visit occurred during the SARS-CoV-2 public health emergency.  Safety protocols were in place, including screening questions prior to the visit, additional usage of staff PPE, and extensive cleaning of exam room while observing appropriate contact time as indicated for disinfecting solutions.   Subjective:   PURITY IRMEN is a 49 y.o. very pleasant female patient with Body mass index is 39.97 kg/m. who presents with the following:  F/u Shingrix vaccine: She is a very nice lady and she got her Shingrix vaccine approximately 8 days ago, and after this 3 or 4 days later she developed vesicular rash on her lower back.  Has not hurt at all, it is not painful or itchy.    Review of Systems is noted in the HPI, as appropriate  Objective:   BP (!) 142/100   Pulse (!) 101   Temp (!) 97 F (36.1 C) (Temporal)   Ht 5' 8.5" (1.74 m)   Wt 266 lb 12 oz (121 kg)   SpO2 96%   BMI 39.97 kg/m   GEN: No acute distress; alert,appropriate. PULM: Breathing comfortably in no respiratory distress PSYCH: Normally interactive.       Laboratory and Imaging Data:  Assessment and Plan:     ICD-10-CM   1. Herpes zoster without complication  Z32.9    Rashes consistent with herpes zoster which can occur after Shingrix, albeit quite uncommon.  We did discuss this in the office, and thankfully she feels fine.  Follow-up: No follow-ups on file.  No orders of the defined types were placed  in this encounter.  There are no discontinued medications. No orders of the defined types were placed in this encounter.   Signed,  Maud Deed. Ytzel Gubler, MD   Outpatient Encounter Medications as of 02/28/2020  Medication Sig  . atorvastatin (LIPITOR) 40 MG tablet Take 0.5 tablets (20 mg total) by mouth daily.  . Calcium Citrate-Vitamin D (CALCIUM + D PO) Take 1 tablet by mouth daily.  . cyclobenzaprine (FLEXERIL) 10 MG tablet TAKE 0.5-1 TABLETS (5-10 MG TOTAL) BY MOUTH AT BEDTIME.  Marland Kitchen diclofenac (VOLTAREN) 75 MG EC tablet Take 1 tablet (75 mg total) by mouth 2 (two) times daily.  Marland Kitchen GLUCOSAMINE-CHONDROITIN PO Take 2 tablets by mouth daily.  . hydrochlorothiazide (HYDRODIURIL) 25 MG tablet TAKE 1 TABLET BY MOUTH EVERY DAY WITH LOSARTAN  . levonorgestrel (MIRENA) 20 MCG/24HR IUD 1 each by Intrauterine route once.  Marland Kitchen losartan (COZAAR) 100 MG tablet TAKE 1 TABLET BY MOUTH EVERY DAY (TAKE WITH HCTZ)  . metFORMIN (GLUCOPHAGE-XR) 500 MG 24 hr tablet TAKE 2 TABLETS BY MOUTH EVERY DAY WITH BREAKFAST  . Multiple Vitamins-Minerals (MULTIVITAMIN WITH MINERALS) tablet Take 1 tablet by mouth daily.  Marland Kitchen triamcinolone (NASACORT ALLERGY 24HR) 55 MCG/ACT AERO nasal inhaler Place 1 spray into the nose daily.    No facility-administered encounter medications on file as of 02/28/2020.

## 2020-04-07 ENCOUNTER — Other Ambulatory Visit: Payer: Self-pay | Admitting: Family Medicine

## 2020-04-08 ENCOUNTER — Other Ambulatory Visit: Payer: Self-pay | Admitting: Family Medicine

## 2020-04-09 NOTE — Telephone Encounter (Signed)
Last office visit 02/28/20 with Dr. Lorelei Pont for shingles. Last refilled 01/29/2020 for #30 with 1 refill.  No future appointments

## 2020-04-12 IMAGING — DX PORTABLE PELVIS 1-2 VIEWS
1 series · 1 of 1 positions shown · non-contrast
Comparison: 11/16/2017

CLINICAL DATA: Status post right hip replacement

EXAM:
PORTABLE PELVIS 1-2 VIEWS

[pelvis ap]
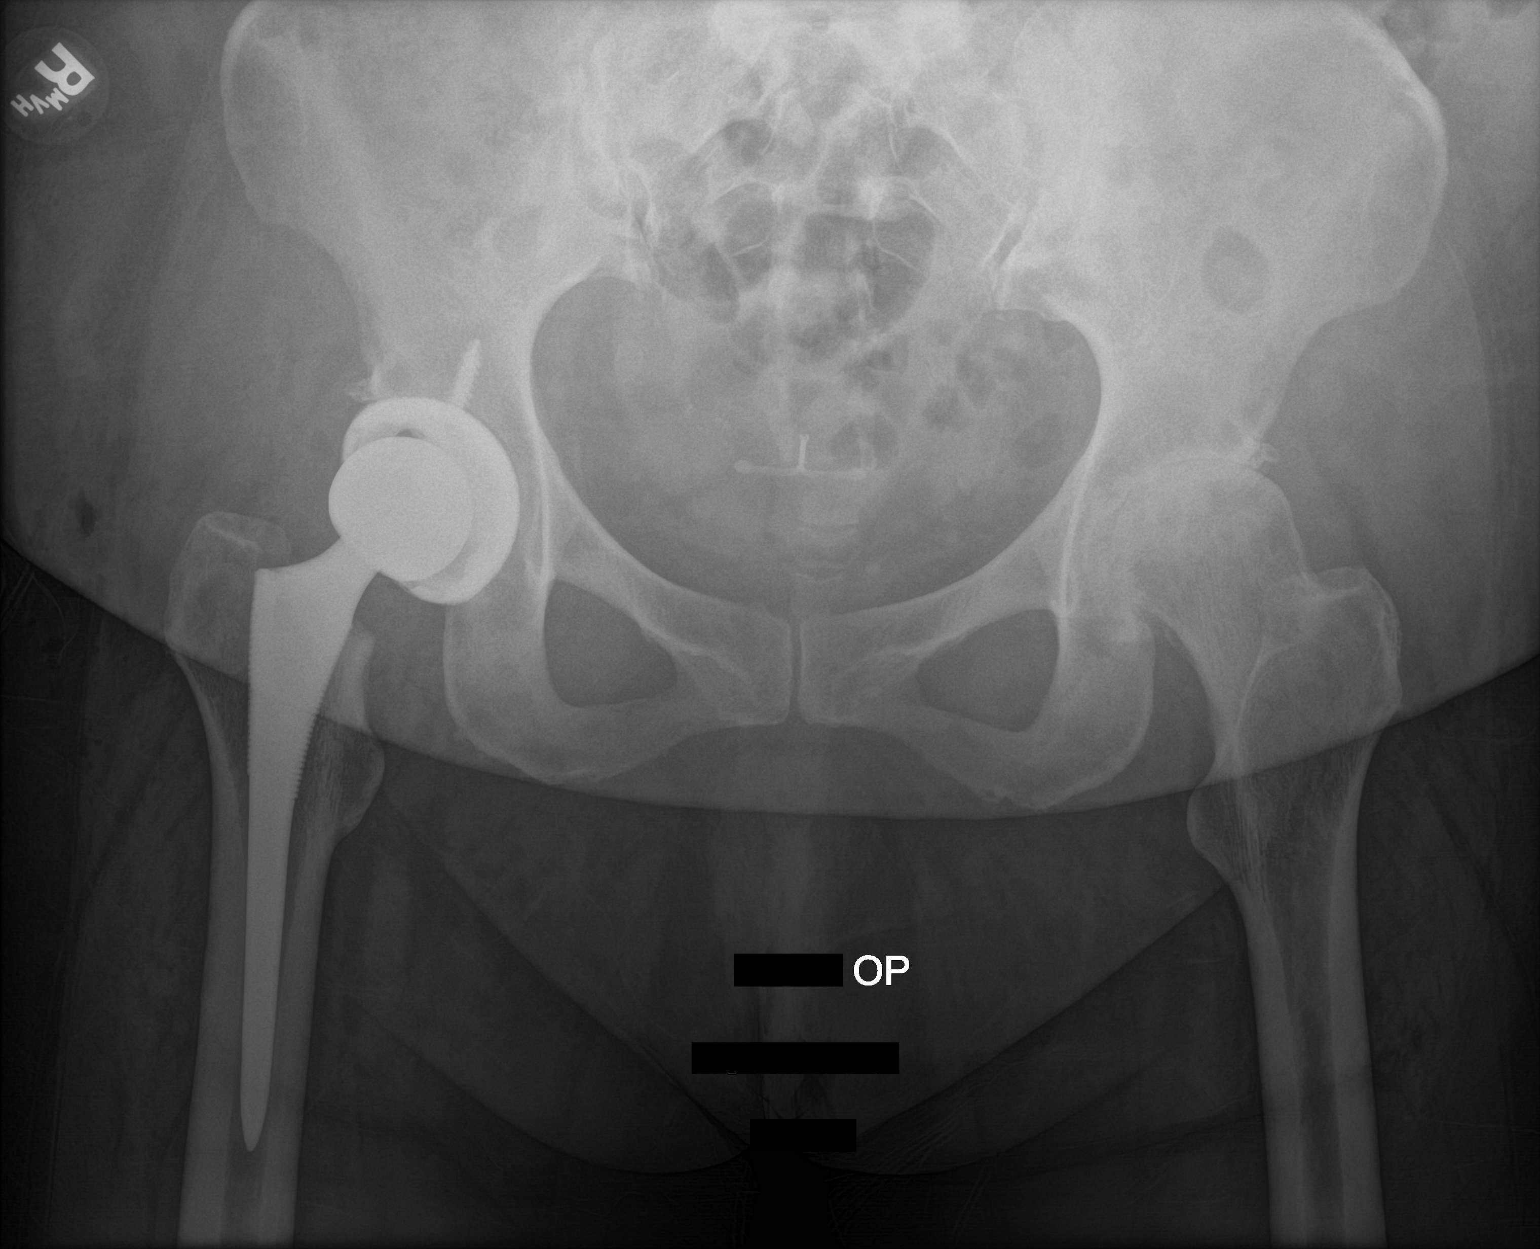

[1 of 1 positions shown; findings below may reference images not displayed]

FINDINGS: Right hip prosthesis is noted. Progressive degenerative changes are
noted in the left hip. IUD is noted in place. The pelvic ring is
intact.
IMPRESSION: Status post right hip prosthesis.

Increase in degenerative changes of the left hip.

## 2020-04-12 IMAGING — DX DG HIP (WITH OR WITHOUT PELVIS) 1V PORT RIGHT
1 series · 1 of 1 positions shown · non-contrast
Comparison: 11/16/2017

CLINICAL DATA: Status post right hip replacement

EXAM:
DG HIP (WITH OR WITHOUT PELVIS) 1V PORT RIGHT

[hip ap]
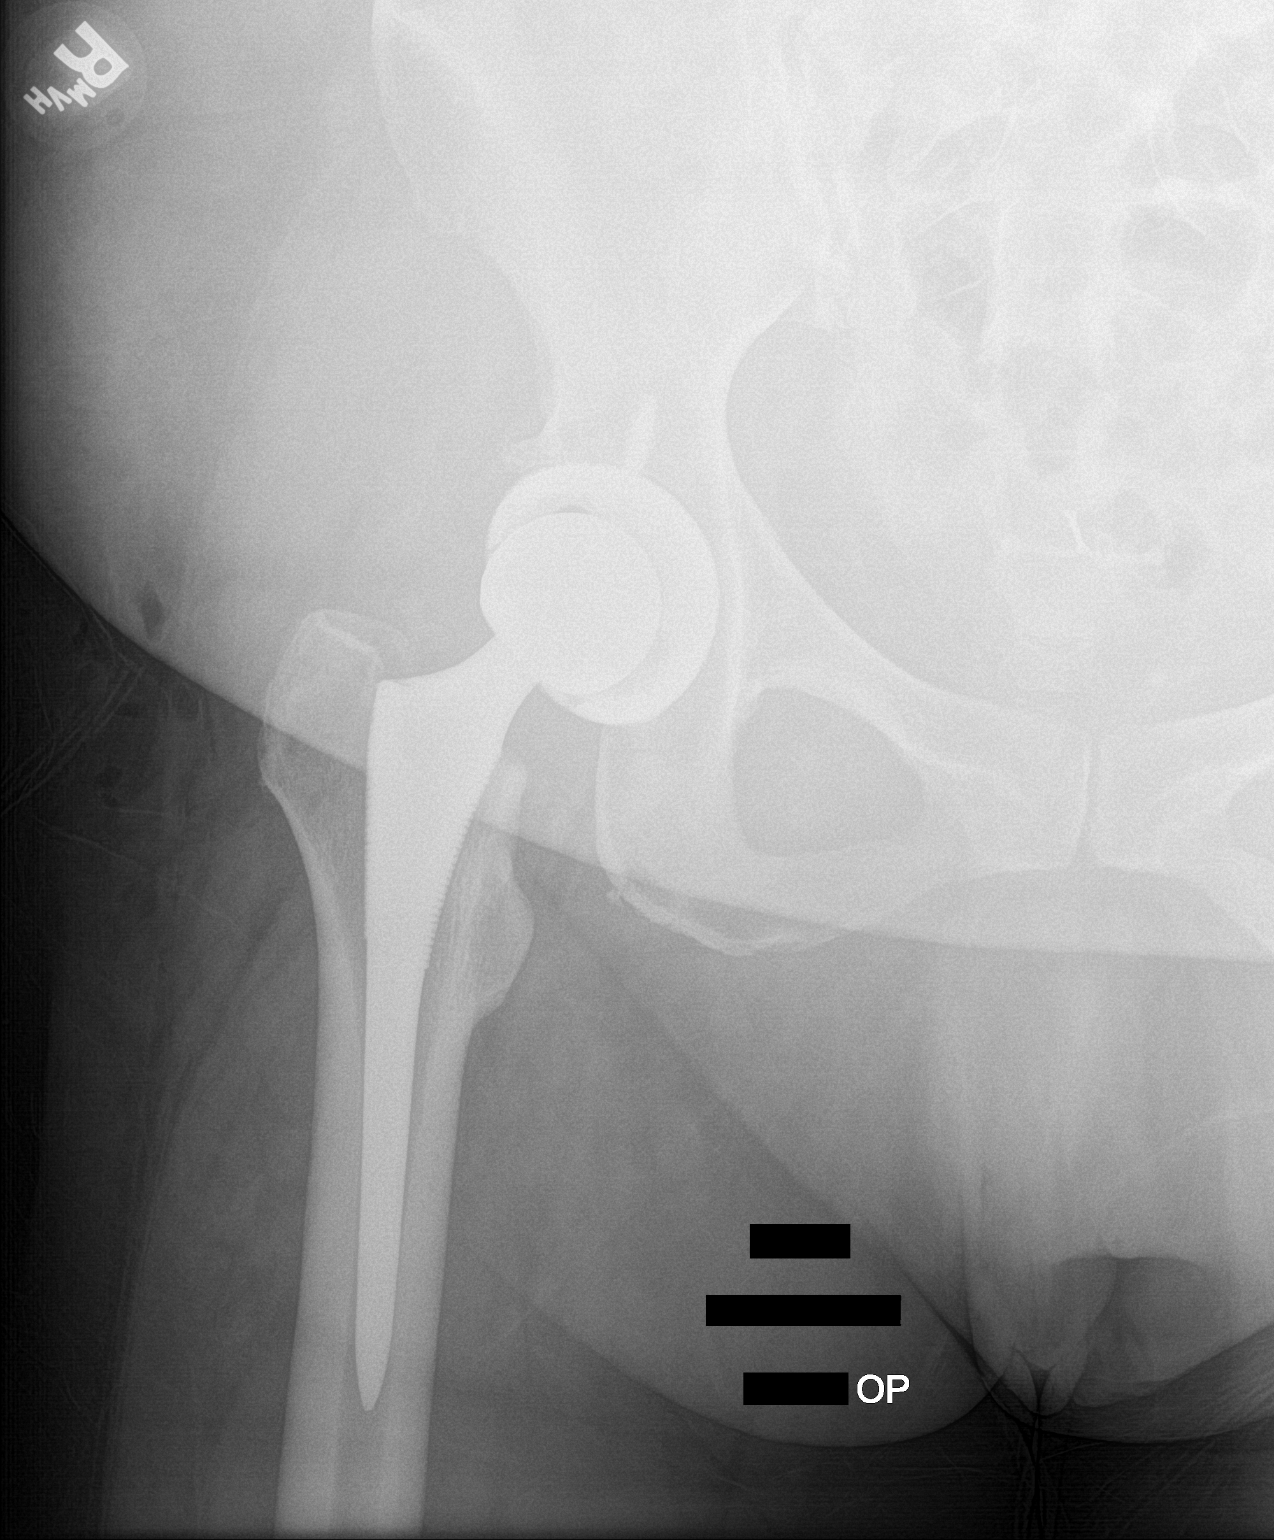

[1 of 1 positions shown; findings below may reference images not displayed]

FINDINGS: Right hip prosthesis is noted in satisfactory position. No acute
fracture or dislocation is seen. IUD is noted in place.
IMPRESSION: Right hip prosthesis in satisfactory position.

## 2020-05-01 ENCOUNTER — Other Ambulatory Visit: Payer: Self-pay | Admitting: Family Medicine

## 2020-05-01 NOTE — Telephone Encounter (Signed)
Last office visit 02/28/2020 with Dr. Lorelei Pont for  shingles.  Last refilled 02/06/2020 for #180 with no refills.  No future appointments.

## 2020-05-31 ENCOUNTER — Other Ambulatory Visit: Payer: Self-pay | Admitting: Family Medicine

## 2020-05-31 NOTE — Telephone Encounter (Signed)
Last office visit 02/28/2020 with Dr. Lorelei Pont for Shingles.  Last refilled 04/09/2020 for #30 with 1 refill.  No future appointments.

## 2020-06-07 IMAGING — DX DG PORTABLE PELVIS
1 series · 1 of 1 positions shown · non-contrast
Comparison: Single-view of the pelvis 01/31/2019.

CLINICAL DATA: Patient status post left hip replacement today.
Incorrect instrument count.

EXAM:
PORTABLE PELVIS 1-2 VIEWS

[pelvis ap]
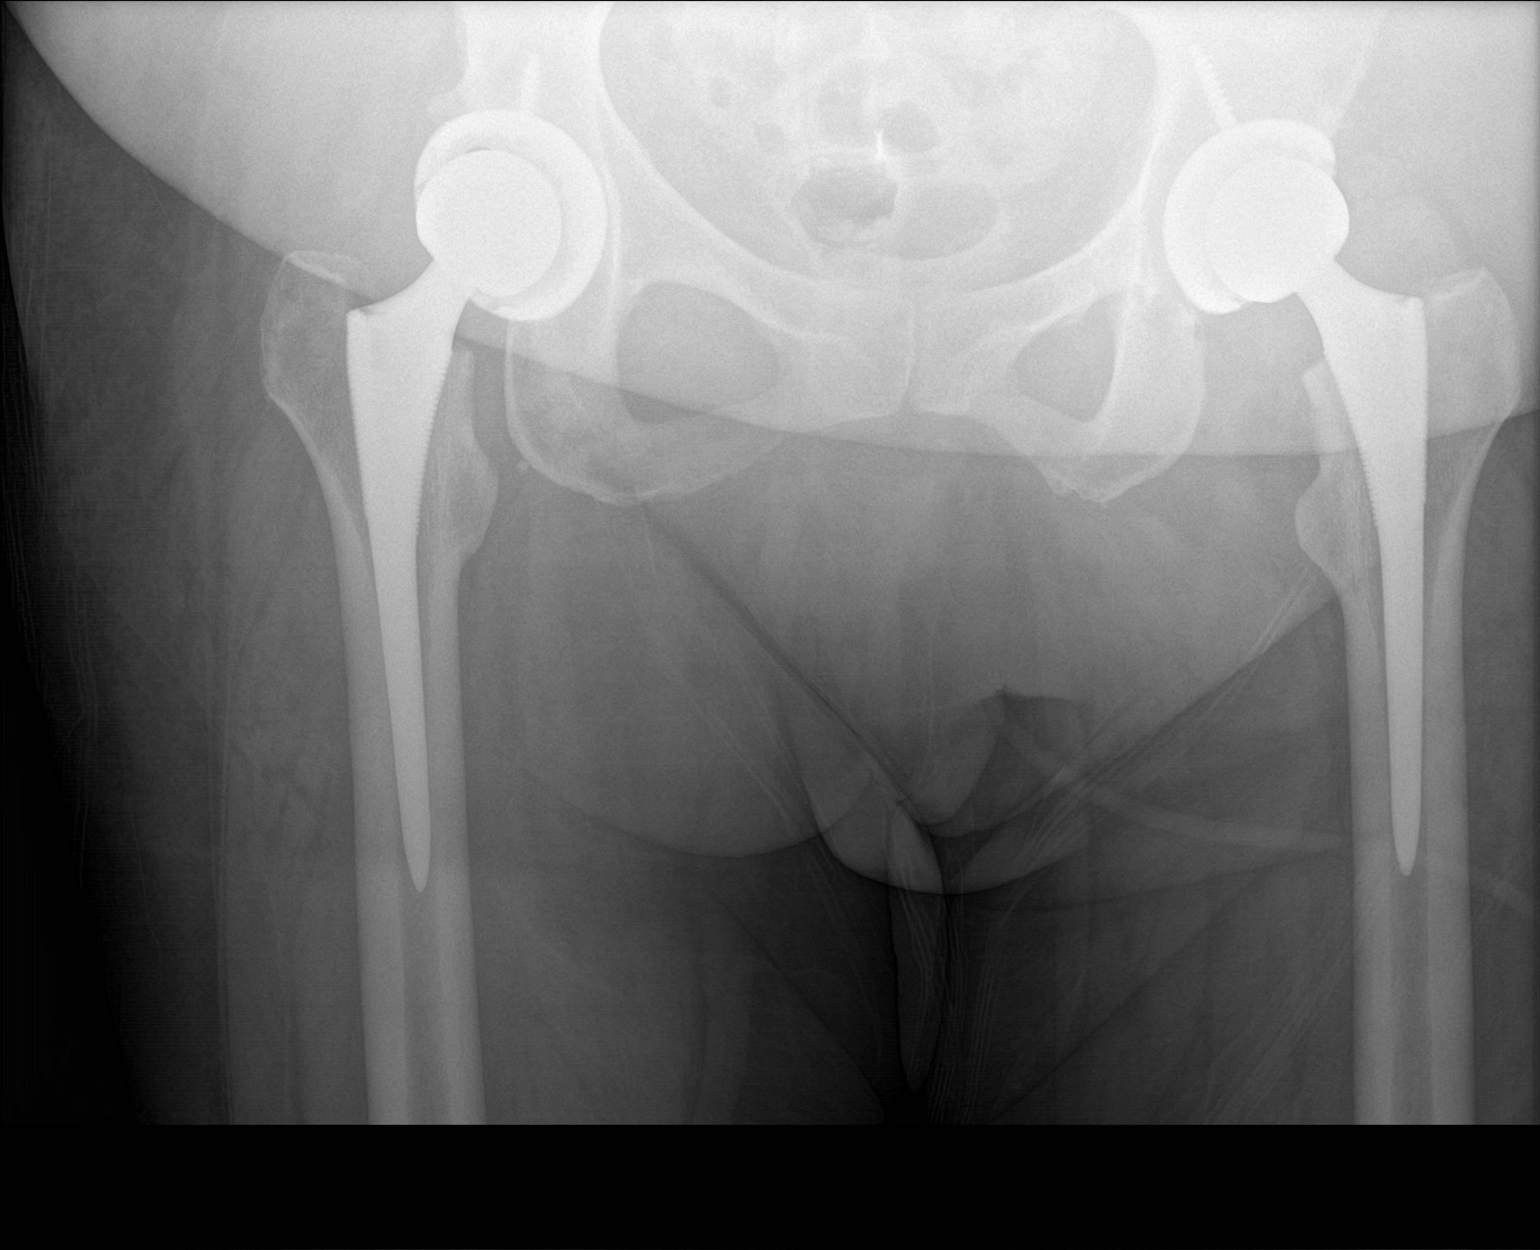

[1 of 1 positions shown; findings below may reference images not displayed]

FINDINGS: New left hip replacement is in place. Prior right hip replacement
noted. No unexpected radiopaque foreign body is seen. Soft tissues
peripheral to the left hip are off the margin the of the film.
IMPRESSION: Negative for unexpected radiopaque foreign body.  No acute finding.

## 2020-06-07 IMAGING — DX DG HIP (WITH OR WITHOUT PELVIS) 1V PORT*L*
2 series · 2 of 2 positions shown · non-contrast
Comparison: None.

CLINICAL DATA: Status post left hip replacement

EXAM:
DG HIP (WITH OR WITHOUT PELVIS) 1V PORT LEFT

[hip ap]
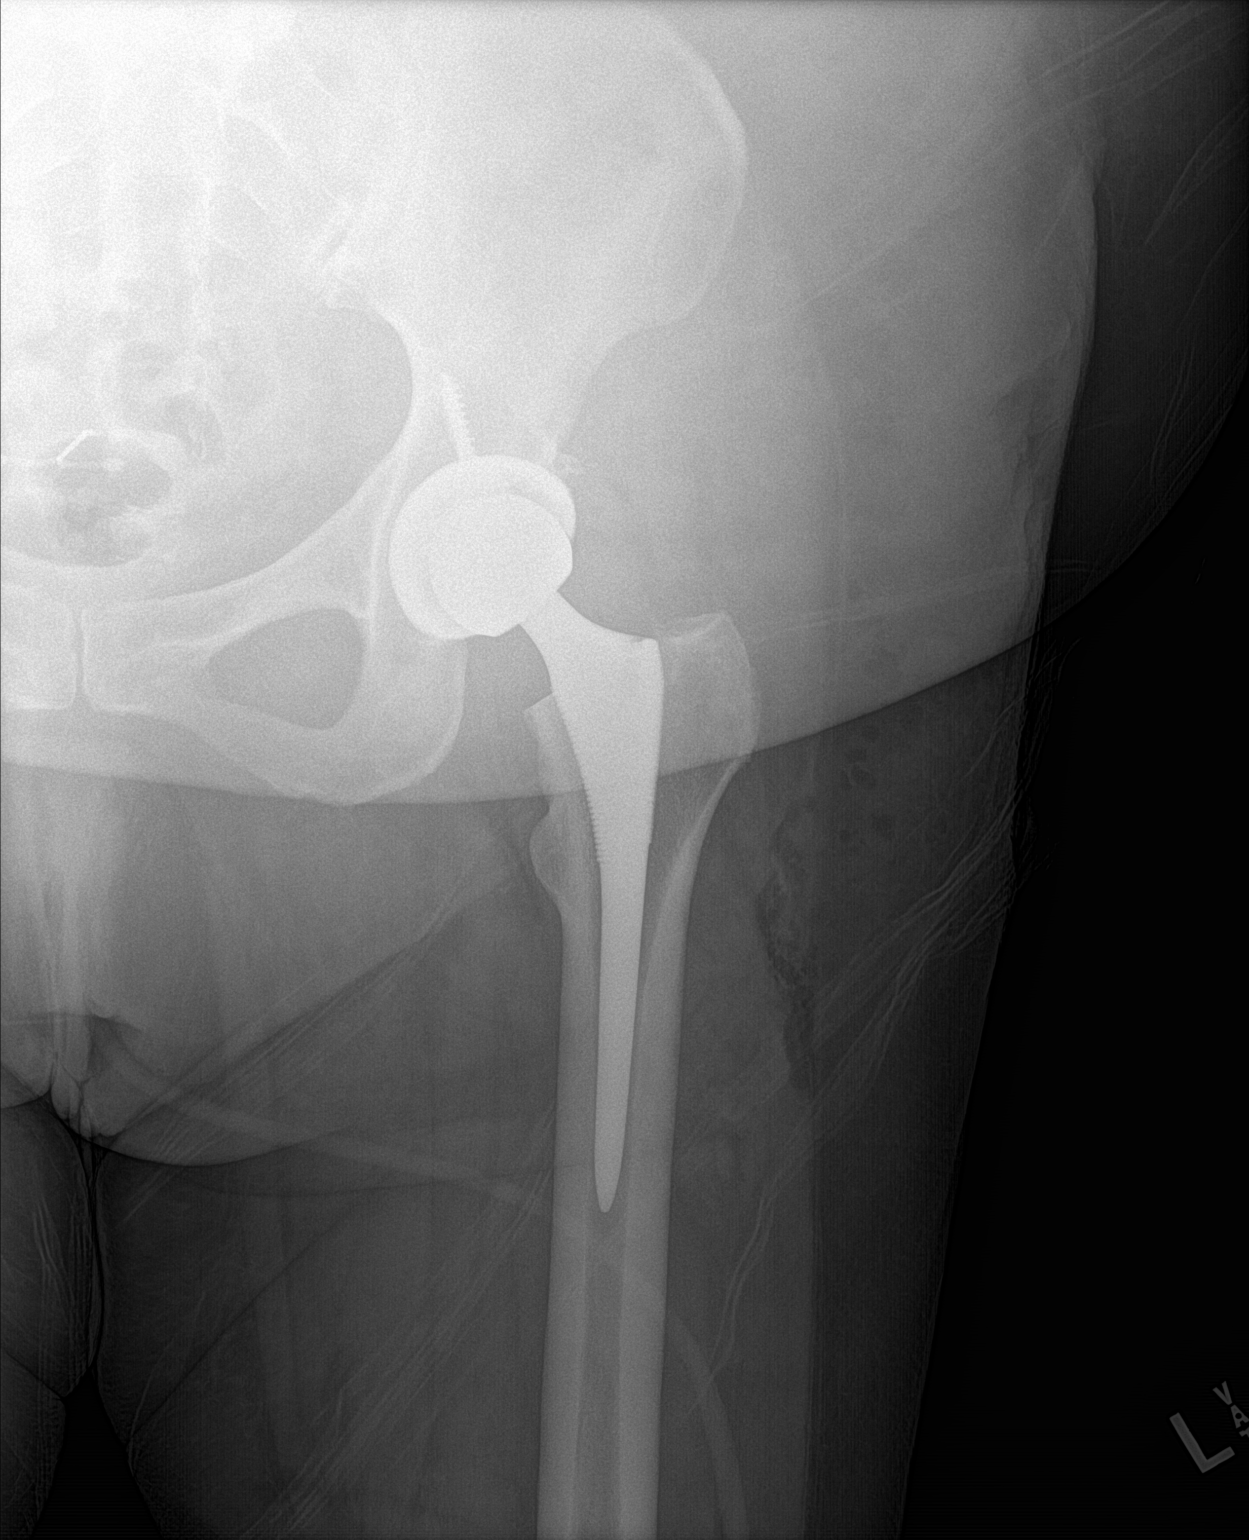

[hip frog leg]
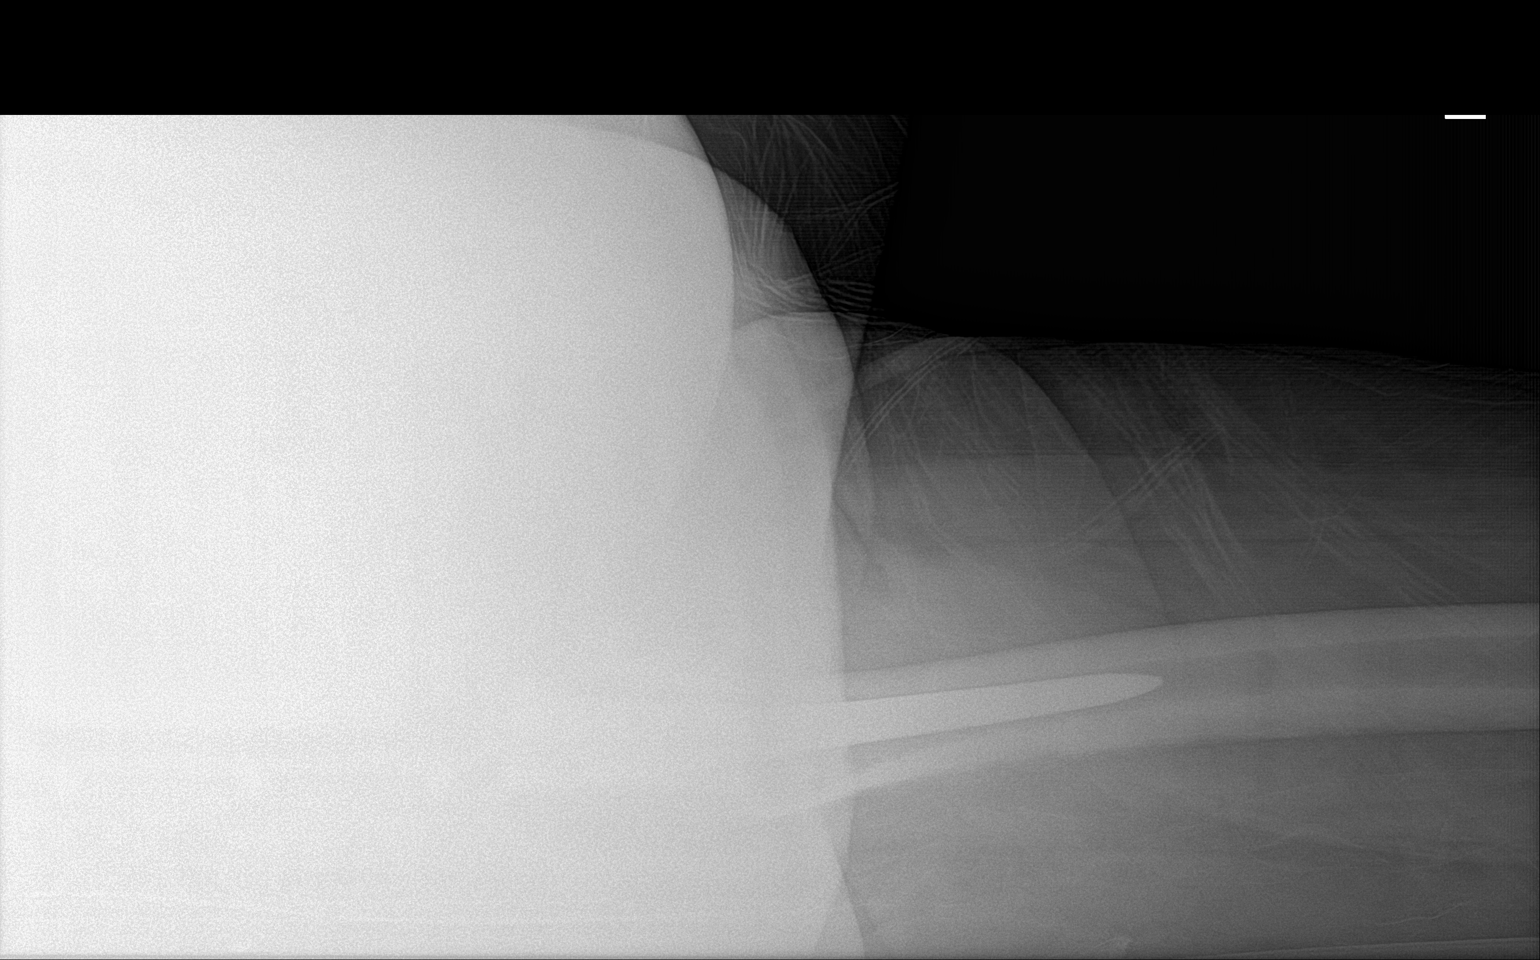

[2 of 2 positions shown; findings below may reference images not displayed]

FINDINGS: Left hip prosthesis is noted in satisfactory position. IUD is noted
in place. No bony or soft tissue abnormality is noted. No radiopaque
foreign body is seen.
IMPRESSION: Status post hip replacement.  No radiopaque foreign body is noted.

## 2020-06-10 ENCOUNTER — Telehealth: Payer: Self-pay | Admitting: *Deleted

## 2020-06-10 NOTE — Telephone Encounter (Signed)
Patient left a voicemail stating that she needs to know when she had her shingrix vaccine. Patient stated that she needs this information for her covid booster vaccine. Tried to call the patient back and got her voicemail. Left message for patient to call the office back.

## 2020-06-12 NOTE — Telephone Encounter (Signed)
Left another message on voicemail for patient to call back. Will wait for patient to call the office back.

## 2020-06-13 NOTE — Telephone Encounter (Signed)
Left message for Tami that she has has one Shingrix vaccine 02/20/2020.  She does need a 2nd dose 2 to 6 months after the first dose.

## 2020-07-19 ENCOUNTER — Other Ambulatory Visit: Payer: Self-pay | Admitting: Family Medicine

## 2020-07-23 ENCOUNTER — Ambulatory Visit: Payer: Managed Care, Other (non HMO) | Admitting: Family Medicine

## 2020-07-30 ENCOUNTER — Other Ambulatory Visit: Payer: Self-pay | Admitting: Family Medicine

## 2020-07-30 NOTE — Telephone Encounter (Signed)
Last office visit 02/28/2020 with Dr. Lorelei Pont for shingles.  Last refilled 05/01/2020 for #180 with no refills.  Next Visit 08/08/2020 for 6 month follow up.

## 2020-07-31 ENCOUNTER — Other Ambulatory Visit: Payer: Self-pay | Admitting: Family Medicine

## 2020-07-31 NOTE — Telephone Encounter (Signed)
Last office visit 02/28/2020 for Shingles.  Last refilled 05/31/2020 for #30 with 1 refill.  Next Appt: 08/08/2020 for 6 month follow up.

## 2020-08-02 ENCOUNTER — Other Ambulatory Visit: Payer: Self-pay | Admitting: Family Medicine

## 2020-08-08 ENCOUNTER — Ambulatory Visit: Payer: Managed Care, Other (non HMO) | Admitting: Family Medicine

## 2020-08-13 ENCOUNTER — Ambulatory Visit (INDEPENDENT_AMBULATORY_CARE_PROVIDER_SITE_OTHER): Payer: Self-pay

## 2020-08-13 ENCOUNTER — Other Ambulatory Visit: Payer: Self-pay

## 2020-08-13 DIAGNOSIS — Z23 Encounter for immunization: Secondary | ICD-10-CM

## 2020-08-13 NOTE — Progress Notes (Signed)
Per orders of Dr. Eliezer Lofts, injection of 2nd Shingrix shot given by Lurlean Nanny in Left deltoid   Patient tolerated injection well.

## 2020-08-14 ENCOUNTER — Other Ambulatory Visit: Payer: Self-pay | Admitting: Family Medicine

## 2020-08-14 NOTE — Telephone Encounter (Signed)
Please schedule CPE with fasting labs prior for Dr. Diona Browner for sometime in March.

## 2020-08-14 NOTE — Telephone Encounter (Signed)
Spoke with patient schedule CEP and fasting lab

## 2020-08-21 ENCOUNTER — Telehealth: Payer: Self-pay | Admitting: *Deleted

## 2020-08-21 MED ORDER — VALSARTAN 80 MG PO TABS: 80.0000 mg | ORAL_TABLET | Freq: Every day | ORAL | 11 refills | Status: DC

## 2020-08-21 NOTE — Telephone Encounter (Signed)
Patient left a voicemail stating that she can no longer get her Losartan. Patient stated that the medication is on back order and the pharmacy is not sure when they will get any back in. Patient stated that she has been out of her medication for two days. Patient is requesting a different medication to replace the Losartan. Pharmacy [facility]

## 2020-08-21 NOTE — Telephone Encounter (Signed)
New rx sent in.. let pt know she needs to follow BP closely.. may need to titrate medication up.

## 2020-08-22 NOTE — Telephone Encounter (Signed)
Left message for Kathy Mccarthy that Dr. Diona Browner has sent in new blood pressure medication called Valsartan 80 mg to take one tablet daily.  I told her that  she needs to follow her BP closely.. may need to titrate medication up.

## 2020-09-06 ENCOUNTER — Other Ambulatory Visit: Payer: Self-pay | Admitting: Family Medicine

## 2020-09-12 ENCOUNTER — Telehealth: Payer: Self-pay | Admitting: Family Medicine

## 2020-09-12 DIAGNOSIS — E1169 Type 2 diabetes mellitus with other specified complication: Secondary | ICD-10-CM

## 2020-09-12 DIAGNOSIS — E785 Hyperlipidemia, unspecified: Secondary | ICD-10-CM

## 2020-09-12 DIAGNOSIS — E1159 Type 2 diabetes mellitus with other circulatory complications: Secondary | ICD-10-CM

## 2020-09-12 NOTE — Telephone Encounter (Signed)
-----   Message from Ellamae Sia sent at 09/02/2020 10:07 AM EDT ----- Regarding: Lab orders for Tuesday, 3.29.22 Patient is scheduled for CPX labs, please order future labs, Thanks , Karna Christmas

## 2020-09-17 ENCOUNTER — Other Ambulatory Visit (INDEPENDENT_AMBULATORY_CARE_PROVIDER_SITE_OTHER): Payer: 59

## 2020-09-17 ENCOUNTER — Other Ambulatory Visit: Payer: Self-pay

## 2020-09-17 DIAGNOSIS — E1159 Type 2 diabetes mellitus with other circulatory complications: Secondary | ICD-10-CM | POA: Diagnosis not present

## 2020-09-17 LAB — COMPREHENSIVE METABOLIC PANEL
ALT: 20 U/L (ref 0–35)
AST: 13 U/L (ref 0–37)
Albumin: 4.2 g/dL (ref 3.5–5.2)
Alkaline Phosphatase: 44 U/L (ref 39–117)
BUN: 18 mg/dL (ref 6–23)
CO2: 29 mEq/L (ref 19–32)
Calcium: 9.4 mg/dL (ref 8.4–10.5)
Chloride: 104 mEq/L (ref 96–112)
Creatinine, Ser: 0.8 mg/dL (ref 0.40–1.20)
GFR: 85.3 mL/min (ref >60.00)
Glucose, Bld: 111 mg/dL — ABNORMAL HIGH (ref 70–99)
Potassium: 4.4 mEq/L (ref 3.5–5.1)
Sodium: 140 mEq/L (ref 135–145)
Total Bilirubin: 1.2 mg/dL (ref 0.2–1.2)
Total Protein: 6.9 g/dL (ref 6.0–8.3)

## 2020-09-17 LAB — LIPID PANEL
Cholesterol: 225 mg/dL — ABNORMAL HIGH (ref 0–200)
HDL: 33.9 mg/dL — ABNORMAL LOW (ref >39.00)
Total CHOL/HDL Ratio: 7
Triglycerides: 406 mg/dL — ABNORMAL HIGH (ref 0.0–149.0)

## 2020-09-17 LAB — HEMOGLOBIN A1C: Hgb A1c MFr Bld: 6.7 % — ABNORMAL HIGH (ref 4.6–6.5)

## 2020-09-17 LAB — LDL CHOLESTEROL, DIRECT: Direct LDL: 109 mg/dL

## 2020-09-17 NOTE — Progress Notes (Signed)
No critical labs need to be addressed urgently. We will discuss labs in detail at upcoming office visit.   

## 2020-09-19 ENCOUNTER — Other Ambulatory Visit (INDEPENDENT_AMBULATORY_CARE_PROVIDER_SITE_OTHER): Payer: 59

## 2020-09-19 DIAGNOSIS — Z1211 Encounter for screening for malignant neoplasm of colon: Secondary | ICD-10-CM

## 2020-09-19 LAB — FECAL OCCULT BLOOD, IMMUNOCHEMICAL: Fecal Occult Bld: NEGATIVE

## 2020-09-24 ENCOUNTER — Ambulatory Visit (INDEPENDENT_AMBULATORY_CARE_PROVIDER_SITE_OTHER): Payer: 59 | Admitting: Family Medicine

## 2020-09-24 ENCOUNTER — Other Ambulatory Visit: Payer: Self-pay

## 2020-09-24 ENCOUNTER — Encounter: Payer: Self-pay | Admitting: Family Medicine

## 2020-09-24 VITALS — BP 142/92 | HR 93 | Temp 98.2°F | Ht 68.5 in | Wt 273.8 lb

## 2020-09-24 DIAGNOSIS — Z0000 Encounter for general adult medical examination without abnormal findings: Secondary | ICD-10-CM | POA: Diagnosis not present

## 2020-09-24 DIAGNOSIS — E1169 Type 2 diabetes mellitus with other specified complication: Secondary | ICD-10-CM

## 2020-09-24 DIAGNOSIS — E1159 Type 2 diabetes mellitus with other circulatory complications: Secondary | ICD-10-CM

## 2020-09-24 DIAGNOSIS — E785 Hyperlipidemia, unspecified: Secondary | ICD-10-CM

## 2020-09-24 DIAGNOSIS — M25561 Pain in right knee: Secondary | ICD-10-CM

## 2020-09-24 DIAGNOSIS — I152 Hypertension secondary to endocrine disorders: Secondary | ICD-10-CM

## 2020-09-24 MED ORDER — VALSARTAN-HYDROCHLOROTHIAZIDE 80-12.5 MG PO TABS: 1.0000 | ORAL_TABLET | Freq: Every day | ORAL | 3 refills | Status: DC

## 2020-09-24 NOTE — Progress Notes (Signed)
Patient ID: [name], female    DOB: Sep 21, 1970, 50 y.o.   MRN: [id number]  This visit was conducted in person.  BP (!) 142/92   Pulse 93   Temp 98.2 F (36.8 C) (Temporal)   Ht 5' 8.5" (1.74 m)   Wt 273 lb 12 oz (124.2 kg)   SpO2 94%   BMI 41.02 kg/m    CC:  Chief Complaint  Patient presents with  . Annual Exam    Subjective:   HPI: Kathy Mccarthy is a 50 y.o. female presenting on 09/24/2020 for Annual Exam   Husband died in 08/08/22  after MVA.    She is having pain in right knee... with kneeling .  Has  Noted area on swelling in lateral inferior to knee cap.  No redness.  No known injury/fall  No bending, climbing stairs.  Elevated Cholesterol:  LDL almost  at goal trig remain high Lab Results  Component Value Date   CHOL 225 (H) 09/17/2020   HDL 33.90 (L) 09/17/2020   LDLDIRECT 109.0 09/17/2020   TRIG (H) 09/17/2020    406.0 Triglyceride is over 400; calculations on Lipids are invalid.   CHOLHDL 7 09/17/2020  Using medications without problems: Muscle aches:  Diet compliance: moderate Exercise: walking some Other complaints:  Hypertension:    Elevated in office today on valsartan and HCTZ BP Readings from Last 3 Encounters:  09/24/20 (!) 142/92  02/28/20 (!) 142/100  02/20/20 (!) 144/92  Using medication without problems or lightheadedness:  none Chest pain with exertion: Edema: none Short of breath: none Average home BPs:  Not checking at home Other issues:  Diabetes:   Good control on metfomrin Lab Results  Component Value Date   HGBA1C 6.7 (H) 09/17/2020  Using medications without difficulties: none Hypoglycemic episodes: Hyperglycemic episodes: Feet problems: no ulcer Blood Sugars averaging: not checking eye exam within last year: yes        Relevant past medical, surgical, family and social history reviewed and updated as indicated. Interim medical history since our last visit reviewed. Allergies and medications reviewed and  updated. Outpatient Medications Prior to Visit  Medication Sig Dispense Refill  . atorvastatin (LIPITOR) 40 MG tablet TAKE 1/2 TABLET BY MOUTH DAILY 45 tablet 1  . Calcium Citrate-Vitamin D (CALCIUM + D PO) Take 1 tablet by mouth daily.    . cyclobenzaprine (FLEXERIL) 10 MG tablet TAKE 0.5-1 TABLETS (5-10 MG TOTAL) BY MOUTH AT BEDTIME. 30 tablet 1  . diclofenac (VOLTAREN) 75 MG EC tablet TAKE 1 TABLET BY MOUTH TWICE A DAY 180 tablet 0  . GLUCOSAMINE-CHONDROITIN PO Take 2 tablets by mouth daily.    Marland Kitchen levonorgestrel (MIRENA) 20 MCG/24HR IUD 1 each by Intrauterine route once.    . metFORMIN (GLUCOPHAGE-XR) 500 MG 24 hr tablet TAKE 2 TABLETS BY MOUTH EVERY DAY WITH BREAKFAST 180 tablet 0  . Multiple Vitamins-Minerals (MULTIVITAMIN WITH MINERALS) tablet Take 1 tablet by mouth daily.    Marland Kitchen triamcinolone (NASACORT) 55 MCG/ACT AERO nasal inhaler Place 1 spray into the nose daily.     . valsartan (DIOVAN) 80 MG tablet Take 1 tablet (80 mg total) by mouth daily. 30 tablet 11  . hydrochlorothiazide (HYDRODIURIL) 25 MG tablet TAKE 1 TABLET BY MOUTH EVERY DAY WITH LOSARTAN (Patient not taking: Reported on 09/24/2020) 90 tablet 1   No facility-administered medications prior to visit.     Per HPI unless specifically indicated in ROS section below Review of Systems  Constitutional: Negative  for fatigue and fever.  HENT: Negative for congestion.   Eyes: Negative for pain.  Respiratory: Negative for cough and shortness of breath.   Cardiovascular: Negative for chest pain, palpitations and leg swelling.  Gastrointestinal: Negative for abdominal pain.  Genitourinary: Negative for dysuria and vaginal bleeding.  Musculoskeletal: Negative for back pain.  Neurological: Negative for syncope, light-headedness and headaches.  Psychiatric/Behavioral: Negative for dysphoric mood.   Objective:  BP (!) 142/92   Pulse 93   Temp 98.2 F (36.8 C) (Temporal)   Ht 5' 8.5" (1.74 m)   Wt 273 lb 12 oz (124.2 kg)   SpO2  94%   BMI 41.02 kg/m   Wt Readings from Last 3 Encounters:  09/24/20 273 lb 12 oz (124.2 kg)  02/28/20 266 lb 12 oz (121 kg)  02/20/20 275 lb (124.7 kg)      Physical Exam Constitutional:      General: She is not in acute distress.    Appearance: Normal appearance. She is well-developed. She is obese. She is not ill-appearing or toxic-appearing.  HENT:     Head: Normocephalic.     Right Ear: Hearing, tympanic membrane, ear canal and external ear normal.     Left Ear: Hearing, tympanic membrane, ear canal and external ear normal.     Nose: Nose normal.  Eyes:     General: Lids are normal. Lids are everted, no foreign bodies appreciated.     Conjunctiva/sclera: Conjunctivae normal.     Pupils: Pupils are equal, round, and reactive to light.  Neck:     Thyroid: No thyroid mass or thyromegaly.     Vascular: No carotid bruit.     Trachea: Trachea normal.  Cardiovascular:     Rate and Rhythm: Normal rate and regular rhythm.     Heart sounds: Normal heart sounds, S1 normal and S2 normal. No murmur heard. No gallop.   Pulmonary:     Effort: Pulmonary effort is normal. No respiratory distress.     Breath sounds: Normal breath sounds. No wheezing, rhonchi or rales.  Abdominal:     General: Bowel sounds are normal. There is no distension or abdominal bruit.     Palpations: Abdomen is soft. There is no fluid wave or mass.     Tenderness: There is no abdominal tenderness. There is no guarding or rebound.     Hernia: No hernia is present.  Musculoskeletal:     Cervical back: Normal range of motion and neck supple.     Right knee: Swelling present. No deformity, effusion, bony tenderness or crepitus. Normal range of motion. Tenderness present over the lateral joint line. No medial joint line, MCL, LCL, ACL, PCL or patellar tendon tenderness. Normal alignment, normal meniscus and normal patellar mobility.       Legs:     Comments: Small hypersensitive pocket of swelling laterally at edge f  joint line  otherwise normal knee exam  Lymphadenopathy:     Cervical: No cervical adenopathy.  Skin:    General: Skin is warm and dry.     Findings: No rash.  Neurological:     Mental Status: She is alert.     Cranial Nerves: No cranial nerve deficit.     Sensory: No sensory deficit.  Psychiatric:        Mood and Affect: Mood is not anxious or depressed.        Speech: Speech normal.        Behavior: Behavior normal. Behavior is cooperative.  Judgment: Judgment normal.       Results for orders placed or performed in visit on 09/19/20  Fecal occult blood, imunochemical   Specimen: Stool  Result Value Ref Range   Fecal Occult Bld Negative Negative    This visit occurred during the SARS-CoV-2 public health emergency.  Safety protocols were in place, including screening questions prior to the visit, additional usage of staff PPE, and extensive cleaning of exam room while observing appropriate contact time as indicated for disinfecting solutions.   COVID 19 screen:  No recent travel or known exposure to COVID19 The patient denies respiratory symptoms of COVID 19 at this time. The importance of social distancing was discussed today.   Assessment and Plan The patient's preventative maintenance and recommended screening tests for an annual wellness exam were reviewed in full today. Brought up to date unless services declined.  Counselled on the importance of diet, exercise, and its role in overall health and mortality. The patient's FH and SH was reviewed, including their home life, tobacco status, and drug and alcohol status.   Vaccines: . COVID 19  X 2, shingrix done Pap/DVE:  Sees GYN ... scheduled Mammo:    Scheduled AUNT WITH [facility]ER Colon:  Negative stool cards. Smoking Status:nonsmoker ETOH/ drug MOL:MBEM/LJQG HIV screen:  refused    Problem List Items Addressed This Visit    Acute pain of right knee     Treat with topical diclofenac.. if not improving  refer to sports medicine for further eval. Work on weight loss      Hyperlipidemia associated with type 2 diabetes mellitus ([place])    LDl almost at goal on statin.. trig remain high. Encouraged exercise, weight loss, healthy eating habits.       Relevant Medications   valsartan-hydrochlorothiazide (DIOVAN-HCT) 80-12.5 MG tablet   Hypertension associated with diabetes (H[facility])    Chronic.Marland Kitchen elevated after med change to valsartan due to shortage. Add back HCTZ as she stopped taking this.. may also need additional valsartan.. will follow.       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  Continue current medication.   Metformin XR 500 mg 2 tabs daily      Relevant Medications   valsartan-hydrochlorothiazide (DIOVAN-HCT) 80-12.5 MG tablet    Other Visit Diagnoses    Routine general medical examination at a health care facility    -  Primary      Eliezer Lofts, MD

## 2020-09-24 NOTE — Patient Instructions (Addendum)
Change to combination valsartan HCTZ... follow BP at home.Marland Kitchen goal < 140/90. Apply topical diclofenac to  Right knee up to four times daily  Over area of tenderness.  If not improving in 2 weeks follow up with Dr. Lorelei Pont our sports medicine doctor.

## 2020-09-25 DIAGNOSIS — M25561 Pain in right knee: Secondary | ICD-10-CM | POA: Insufficient documentation

## 2020-09-25 NOTE — Assessment & Plan Note (Signed)
Treat with topical diclofenac.. if not improving refer to sports medicine for further eval. Work on weight loss

## 2020-09-25 NOTE — Assessment & Plan Note (Signed)
LDl almost at goal on statin.. trig remain high. Encouraged exercise, weight loss, healthy eating habits.

## 2020-09-25 NOTE — Assessment & Plan Note (Signed)
Stable, chronic.  Continue current medication.   Metformin XR 500 mg 2 tabs daily

## 2020-09-25 NOTE — Assessment & Plan Note (Signed)
Chronic.Marland Kitchen elevated after med change to valsartan due to shortage. Add back HCTZ as she stopped taking this.. may also need additional valsartan.. will follow.

## 2020-09-30 ENCOUNTER — Ambulatory Visit (INDEPENDENT_AMBULATORY_CARE_PROVIDER_SITE_OTHER): Payer: 59 | Admitting: Dermatology

## 2020-09-30 ENCOUNTER — Other Ambulatory Visit: Payer: Self-pay

## 2020-09-30 DIAGNOSIS — L820 Inflamed seborrheic keratosis: Secondary | ICD-10-CM

## 2020-09-30 DIAGNOSIS — L821 Other seborrheic keratosis: Secondary | ICD-10-CM

## 2020-09-30 DIAGNOSIS — L578 Other skin changes due to chronic exposure to nonionizing radiation: Secondary | ICD-10-CM

## 2020-09-30 DIAGNOSIS — D485 Neoplasm of uncertain behavior of skin: Secondary | ICD-10-CM

## 2020-09-30 DIAGNOSIS — D492 Neoplasm of unspecified behavior of bone, soft tissue, and skin: Secondary | ICD-10-CM

## 2020-09-30 NOTE — Patient Instructions (Addendum)
If you have any questions or concerns for your doctor, please call our main line at [phone] and press option 4 to reach your doctor's medical assistant. If no one answers, please leave a voicemail as directed and we will return your call as soon as possible. Messages left after 4 pm will be answered the following business day.   You may also send us a message via MyChart. We typically respond to MyChart messages within 1-2 business days.  For prescription refills, please ask your pharmacy to contact our office. Our fax number is [phone].  If you have an urgent issue when the clinic is closed that cannot wait until the next business day, you can page your doctor at the number below.    Please note that while we do our best to be available for urgent issues outside of office hours, we are not available 24/7.   If you have an urgent issue and are unable to reach us, you may choose to seek medical care at your doctor's office, retail clinic, urgent care center, or emergency room.  If you have a medical emergency, please immediately call 911 or go to the emergency department.  Pager Numbers  - Dr. Kowalski: [phone]  - Dr. Moye: [phone]  - Dr. Stewart: [phone]  In the event of inclement weather, please call our main line at [phone] for an update on the status of any delays or closures.  Dermatology Medication Tips: Please keep the boxes that topical medications come in in order to help keep track of the instructions about where and how to use these. Pharmacies typically print the medication instructions only on the boxes and not directly on the medication tubes.   If your medication is too expensive, please contact our office at [phone] option 4 or send us a message through MyChart.   We are unable to tell what your co-pay for medications will be in advance as this is different depending on your insurance coverage. However, we may be able to find a  substitute medication at lower cost or fill out paperwork to get insurance to cover a needed medication.   If a prior authorization is required to get your medication covered by your insurance company, please allow us 1-2 business days to complete this process.  Drug prices often vary depending on where the prescription is filled and some pharmacies may offer cheaper prices.  The website www.goodrx.com contains coupons for medications through different pharmacies. The prices here do not account for what the cost may be with help from insurance (it may be cheaper with your insurance), but the website can give you the price if you did not use any insurance.  - You can print the associated coupon and take it with your prescription to the pharmacy.  - You may also stop by our office during regular business hours and pick up a GoodRx coupon card.  - If you need your prescription sent electronically to a different pharmacy, notify our office through [place] MyChart or by phone at [phone] option 4.     Wound Care Instructions  1. Cleanse wound gently with soap and water once a day then pat dry with clean gauze. Apply a thing coat of Petrolatum (petroleum jelly, "Vaseline") over the wound (unless you have an allergy to this). We recommend that you use a new, sterile tube of Vaseline. Do not pick or remove scabs. Do not remove the yellow or white "healing tissue" from the base of the wound.    Cover the wound with fresh, clean, nonstick gauze and secure with paper tape. You may use Band-Aids in place of gauze and tape if the would is small enough, but would recommend trimming much of the tape off as there is often too much. Sometimes Band-Aids can irritate the skin.  3. You should call the office for your biopsy report after 1 week if you have not already been contacted.  4. If you experience any problems, such as abnormal amounts of bleeding, swelling, significant bruising, significant pain, or evidence of  infection, please call the office immediately.  5. FOR ADULT SURGERY PATIENTS: If you need something for pain relief you may take 1 extra strength Tylenol (acetaminophen) AND 2 Ibuprofen (200mg  each) together every 4 hours as needed for pain. (do not take these if you are allergic to them or if you have a reason you should not take them.) Typically, you may only need pain medication for 1 to 3 days.      Recommend taking Heliocare sun protection supplement daily in sunny weather for additional sun protection. For maximum protection on the sunniest days, you can take up to 2 capsules of regular Heliocare OR take 1 capsule of Heliocare Ultra. For prolonged exposure (such as a full day in the sun), you can repeat your dose of the supplement 4 hours after your first dose. Heliocare can be purchased at [facility] or at [email or web address].

## 2020-09-30 NOTE — Progress Notes (Signed)
   Follow-Up Visit   Subjective  Kathy Mccarthy is a 50 y.o. female who presents for the following: check spots (L inframammary, L flank, L popliteal, ~6-12 months, irritating).   The following portions of the chart were reviewed this encounter and updated as appropriate:   Tobacco  Allergies  Meds  Problems  Med Hx  Surg Hx  Fam Hx      Review of Systems:  No other skin or systemic complaints except as noted in HPI or Assessment and Plan.  Objective  Well appearing patient in no apparent distress; mood and affect are within normal limits.  A focused examination was performed including inframammary, trunk, legs. Relevant physical exam findings are noted in the Assessment and Plan.  Objective  Left Popliteal Fossa x 1, L inframammary x 4, L abdomen x 6, R upper thigh x 1 (12): Erythematous keratotic or waxy stuck-on papule or plaque.   Objective  Left Upper Arm - Anterior: 0.4cm erythematous tan pap      Assessment & Plan    Seborrheic Keratoses - Stuck-on, waxy, tan-brown papules and/or plaques  - Benign-appearing - Discussed benign etiology and prognosis. - Observe - Call for any changes - Discussed ammonium lactate   Actinic Damage - chronic, secondary to cumulative UV radiation exposure/sun exposure over time - diffuse scaly erythematous macules with underlying dyspigmentation - Recommend daily broad spectrum sunscreen SPF 30+ to sun-exposed areas, reapply every 2 hours as needed.  - Recommend staying in the shade or wearing long sleeves, sun glasses (UVA+UVB protection) and wide brim hats (4-inch brim around the entire circumference of the hat). - Call for new or changing lesions. - discussed [employer]   Inflamed seborrheic keratosis (12) Left Popliteal Fossa x 1, L inframammary x 4, L abdomen x 6, R upper thigh x 1  Prior to procedure, discussed risks of blister formation, small wound, skin dyspigmentation, or rare scar following cryotherapy.      Destruction of lesion - Left Popliteal Fossa x 1, L inframammary x 4, L abdomen x 6, R upper thigh x 1 Complexity: simple   Destruction method: cryotherapy   Informed consent: discussed and consent obtained   Lesion destroyed using liquid nitrogen: Yes   Region frozen until ice ball extended beyond lesion: Yes   Outcome: patient tolerated procedure well with no complications   Post-procedure details: wound care instructions given    Neoplasm of skin Left Upper Arm - Anterior  Epidermal / dermal shaving  Lesion diameter (cm):  0.4 Informed consent: discussed and consent obtained   Patient was prepped and draped in usual sterile fashion: area prepped with alcohol. Anesthesia: the lesion was anesthetized in a standard fashion   Anesthetic:  1% lidocaine w/ epinephrine 1-100,000 buffered w/ 8.4% NaHCO3 Instrument used: flexible razor blade   Hemostasis achieved with: pressure, aluminum chloride and electrodesiccation   Outcome: patient tolerated procedure well   Post-procedure details: wound care instructions given   Post-procedure details comment:  Ointment and small bandage applied  Specimen 1 - Surgical pathology Differential Diagnosis: D48.5 Irritated Skin tag vs Nevus vs other  Check Margins: no 0.4cm erythematous tan pap   Return if symptoms worsen or fail to improve.  I, Kathy Mccarthy, RMA, am acting as scribe for Forest Gleason, MD .  Documentation: I have reviewed the above documentation for accuracy and completeness, and I agree with the above.  Forest Gleason, MD

## 2020-10-01 ENCOUNTER — Encounter: Payer: Self-pay | Admitting: Family Medicine

## 2020-10-07 ENCOUNTER — Encounter: Payer: Self-pay | Admitting: Dermatology

## 2020-10-28 ENCOUNTER — Other Ambulatory Visit: Payer: Self-pay | Admitting: Family Medicine

## 2020-10-28 NOTE — Telephone Encounter (Signed)
Last office visit 09/24/2020 for CPE.  Last refilled 07/30/20 for #180 with no refills.  No future appointments.

## 2020-11-15 LAB — HM MAMMOGRAPHY

## 2021-01-14 ENCOUNTER — Telehealth: Payer: Self-pay | Admitting: *Deleted

## 2021-01-14 NOTE — Telephone Encounter (Signed)
Kathy Mccarthy left voicemail on triage stating her BP has been running high over the last 2 months.  She states her husband was killed in a car wreck back in January so she was thinking her elevated BP may have been due to that but now that it has been 6 months she doesn't think that is the case.  She states she also changed BP meds about that time.  Currently taking Valsartan-HCTZ 80-12.5 mg daily.  Spoke with  Tami.  She states her BP has been running top number 140-145, bottom number around 110.  She denies any CP, HA. Blurred Vision but did have some dizziness a couple days ago, which motivated her to call today.  Per note from Dr. Diona Browner at patient's physical on 09/24/2020:  Chronic.Marland Kitchen elevated after med change to valsartan due to shortage.  Add back HCTZ as she stopped taking this.. may also need additional valsartan.. will follow.

## 2021-01-14 NOTE — Telephone Encounter (Signed)
Call  Can try trial of increased dose to   TWO tabs of 80/12.5 mg daily of valsartan HCTZ.. follow BPs, record measurements and make follow up appt in 2 weeks for BP / depression eval. Verify not red flags such as chest pain, shortness of breath severe headache, new neuro symptoms. If present.. send for triage.

## 2021-01-14 NOTE — Telephone Encounter (Signed)
Kathy Mccarthy notified as instructed by telephone.  Again she denies any CP, SOB, HAs or new neuro symptoms.  Follow up appointment scheduled for 01/28/2021 at 3:00 pm.  Medication list updated.

## 2021-01-28 ENCOUNTER — Encounter: Payer: Self-pay | Admitting: Family Medicine

## 2021-01-28 ENCOUNTER — Other Ambulatory Visit: Payer: Self-pay

## 2021-01-28 ENCOUNTER — Ambulatory Visit (INDEPENDENT_AMBULATORY_CARE_PROVIDER_SITE_OTHER): Payer: 59 | Admitting: Family Medicine

## 2021-01-28 VITALS — BP 126/74 | HR 95 | Temp 97.8°F | Ht 68.5 in | Wt 279.0 lb

## 2021-01-28 DIAGNOSIS — I152 Hypertension secondary to endocrine disorders: Secondary | ICD-10-CM

## 2021-01-28 DIAGNOSIS — E1159 Type 2 diabetes mellitus with other circulatory complications: Secondary | ICD-10-CM

## 2021-01-28 DIAGNOSIS — S93401D Sprain of unspecified ligament of right ankle, subsequent encounter: Secondary | ICD-10-CM

## 2021-01-28 DIAGNOSIS — F418 Other specified anxiety disorders: Secondary | ICD-10-CM

## 2021-01-28 MED ORDER — VALSARTAN-HYDROCHLOROTHIAZIDE 160-25 MG PO TABS: 1.0000 | ORAL_TABLET | Freq: Every day | ORAL | 3 refills | Status: DC

## 2021-01-28 NOTE — Progress Notes (Signed)
Patient ID: Kathy Mccarthy, female    DOB: 1971-01-23, 50 y.o.   MRN: [id number]  This visit was conducted in person.  BP 126/74   Pulse 95   Temp 97.8 F (36.6 C) (Temporal)   Ht 5' 8.5" (1.74 m)   Wt 279 lb (126.6 kg)   SpO2 94%   BMI 41.80 kg/m    CC: Chief Complaint  Patient presents with   Hypertension   Anxiety    Subjective:   HPI: Kathy Mccarthy is a 50 y.o. female presenting on 01/28/2021 for Hypertension and Anxiety  Hypertension:   Now at goal on  2 tabs daily of valsartan WITH HCTZ 80/12.5 mg daily  She is feeling so much better overall. No further headache.  BP Readings from Last 3 Encounters:  01/28/21 126/74  09/24/20 (!) 142/92  02/28/20 (!) 142/100  Using medication without problems or lightheadedness: none Chest pain with exertion:none Edema:none Short of breath:none Average home BPs: Has not been checking lately. Other issues: Wt Readings from Last 3 Encounters:  01/28/21 279 lb (126.6 kg)  09/24/20 273 lb 12 oz (124.2 kg)  02/28/20 266 lb 12 oz (121 kg)     Situational anxiety, She is dealing with her husband's death  better overall.  No further anxiety.  Seeping 7 hours at night.       Relevant past medical, surgical, family and social history reviewed and updated as indicated. Interim medical history since our last visit reviewed. Allergies and medications reviewed and updated. Outpatient Medications Prior to Visit  Medication Sig Dispense Refill   atorvastatin (LIPITOR) 40 MG tablet TAKE 1/2 TABLET BY MOUTH DAILY 45 tablet 1   Calcium Citrate-Vitamin D (CALCIUM + D PO) Take 1 tablet by mouth daily.     cyclobenzaprine (FLEXERIL) 10 MG tablet TAKE 0.5-1 TABLETS (5-10 MG TOTAL) BY MOUTH AT BEDTIME. 30 tablet 1   diclofenac (VOLTAREN) 75 MG EC tablet TAKE 1 TABLET BY MOUTH TWICE A DAY 180 tablet 0   GLUCOSAMINE-CHONDROITIN PO Take 2 tablets by mouth daily.     levonorgestrel (MIRENA) 20 MCG/24HR IUD 1 each by Intrauterine route once.      metFORMIN (GLUCOPHAGE-XR) 500 MG 24 hr tablet TAKE 2 TABLETS BY MOUTH EVERY DAY WITH BREAKFAST 180 tablet 0   Multiple Vitamins-Minerals (MULTIVITAMIN WITH MINERALS) tablet Take 1 tablet by mouth daily.     triamcinolone (NASACORT) 55 MCG/ACT AERO nasal inhaler Place 1 spray into the nose daily.      valsartan-hydrochlorothiazide (DIOVAN-HCT) 80-12.5 MG tablet Take 2 tablets by mouth daily.     No facility-administered medications prior to visit.     Per HPI unless specifically indicated in ROS section below Review of Systems Objective:  BP 126/74   Pulse 95   Temp 97.8 F (36.6 C) (Temporal)   Ht 5' 8.5" (1.74 m)   Wt 279 lb (126.6 kg)   SpO2 94%   BMI 41.80 kg/m   Wt Readings from Last 3 Encounters:  01/28/21 279 lb (126.6 kg)  09/24/20 273 lb 12 oz (124.2 kg)  02/28/20 266 lb 12 oz (121 kg)      Physical Exam Musculoskeletal:     Right ankle: Swelling and ecchymosis present. Tenderness present. Decreased range of motion.      Results for orders placed or performed in visit on 10/01/20  HM MAMMOGRAPHY  Result Value Ref Range   HM Mammogram 0-4 Bi-Rad 0-4 Bi-Rad, Self Reported Normal  HM PAP SMEAR  Result Value Ref Range   HM Pap smear      Negative for Intraepithelial Lesions or malignancy.  Benign reactiive/reparative changes.  HPV Negative    This visit occurred during the SARS-CoV-2 public health emergency.  Safety protocols were in place, including screening questions prior to the visit, additional usage of staff PPE, and extensive cleaning of exam room while observing appropriate contact time as indicated for disinfecting solutions.   COVID 19 screen:  No recent travel or known exposure to COVID19 The patient denies respiratory symptoms of COVID 19 at this time. The importance of social distancing was discussed today.   Assessment and Plan    Problem List Items Addressed This Visit     Hypertension associated with diabetes ([place]) - Primary    Stable,  chronic.  Continue current medication.  2 tabs daily of valsartan WITH HCTZ 80/12.5 mg daily      Relevant Medications   valsartan-hydrochlorothiazide (DIOVAN-HCT) 160-25 MG tablet   Situational anxiety    Significant improvement over time. Normal grieving. Sleeping better. Focusing more  on self care.      Sprain of right ankle    Wear lace up ankle brace on right ankle.  Start home physical therapy and rehab.  Elevated foot, apply ice as needed.        Eliezer Lofts, MD

## 2021-01-28 NOTE — Patient Instructions (Signed)
Wear lace up ankle brace on right ankle.  Start home physical therapy and rehab.  Elevated foot, apply ice as needed.  Continue current blood pressure medication dose.

## 2021-02-01 ENCOUNTER — Other Ambulatory Visit: Payer: Self-pay | Admitting: Family Medicine

## 2021-02-02 NOTE — Telephone Encounter (Signed)
Last office visit 01/28/21 for HTN.  Last refilled 10/29/20 for #180 with no refills.  CPE scheduled for 05/02/21.

## 2021-03-02 ENCOUNTER — Other Ambulatory Visit: Payer: Self-pay | Admitting: Family Medicine

## 2021-03-10 DIAGNOSIS — F418 Other specified anxiety disorders: Secondary | ICD-10-CM | POA: Insufficient documentation

## 2021-03-10 DIAGNOSIS — S93401A Sprain of unspecified ligament of right ankle, initial encounter: Secondary | ICD-10-CM | POA: Insufficient documentation

## 2021-03-10 NOTE — Assessment & Plan Note (Signed)
Wear lace up ankle brace on right ankle.  Start home physical therapy and rehab.  Elevated foot, apply ice as needed.

## 2021-03-10 NOTE — Assessment & Plan Note (Addendum)
Significant improvement over time. Normal grieving. Sleeping better. Focusing more  on self care.

## 2021-03-10 NOTE — Assessment & Plan Note (Signed)
Stable, chronic.  Continue current medication.  2 tabs daily of valsartan WITH HCTZ 80/12.5 mg daily

## 2021-03-18 LAB — HM DIABETES EYE EXAM

## 2021-04-23 ENCOUNTER — Telehealth: Payer: Self-pay | Admitting: Family Medicine

## 2021-04-23 DIAGNOSIS — E1159 Type 2 diabetes mellitus with other circulatory complications: Secondary | ICD-10-CM

## 2021-04-23 NOTE — Telephone Encounter (Signed)
-----   Message from Ellamae Sia sent at 04/11/2021 10:53 AM EDT ----- Regarding: Lab orders for Friday, 11.4.22 .Lab orders for a 3 month follow up appt.

## 2021-04-25 ENCOUNTER — Other Ambulatory Visit: Payer: 59

## 2021-05-02 ENCOUNTER — Encounter: Payer: 59 | Admitting: Family Medicine

## 2021-05-10 ENCOUNTER — Other Ambulatory Visit: Payer: Self-pay | Admitting: Family Medicine

## 2021-05-12 NOTE — Telephone Encounter (Signed)
Last office visit 01/28/2021 for HTN & Anxiety.  Last refilled 02/03/2021 for #180 with no refills.  CPE scheduled for 06/26/2021.

## 2021-06-24 ENCOUNTER — Encounter: Payer: 59 | Admitting: Family Medicine

## 2021-06-26 ENCOUNTER — Ambulatory Visit: Payer: 59 | Admitting: Family Medicine

## 2021-08-13 ENCOUNTER — Other Ambulatory Visit: Payer: Self-pay | Admitting: Family Medicine

## 2021-08-13 NOTE — Telephone Encounter (Signed)
Please call and schedule: DM follow up with labs prior.  She was due to follow up in 04/2021.

## 2021-08-14 ENCOUNTER — Other Ambulatory Visit: Payer: Self-pay | Admitting: Family Medicine

## 2021-08-14 NOTE — Telephone Encounter (Signed)
Last office visit 01/28/2021 for HTN/Anxiety.  Last refilled 05/12/2021 for #180 with no refills.  No future appointments.

## 2021-08-27 ENCOUNTER — Other Ambulatory Visit: Payer: Self-pay | Admitting: Family Medicine

## 2021-08-27 NOTE — Telephone Encounter (Signed)
Please schedule CPE with fasting labs prior for sometime after 09/24/21 with Dr. Diona Browner.  ?

## 2021-09-26 NOTE — Telephone Encounter (Signed)
Left message on VM per DPR advising pt to call and set up DM F/U or let us know if she has changed providers as she has had multiple cancellations.  ?

## 2021-10-28 ENCOUNTER — Telehealth: Payer: Self-pay | Admitting: Family Medicine

## 2021-10-28 NOTE — Telephone Encounter (Signed)
I had to lvm, Pts last appt that was scheduled 07/16/21 was canceled with the reason that insurance was no longer in network with [place] so I needed to know if we need to remove Dr Diona Browner as PCP. Lvm for pt to call back ?

## 2021-10-28 NOTE — Telephone Encounter (Signed)
I tried to call based on medication response note:  ?Carter Kitten, CMA routed conversation to [employer] 2 months ago  ? ?Carter Kitten, CMA 2 months ago  ? ?Please schedule CPE with fasting labs prior for sometime after 09/24/21 with Dr. Diona Browner.   ?  ?I had to lvm, Pts last appt that was scheduled 07/16/21 was canceled with the reason that insurance was no longer in network with [place] so I needed to know if we need to remove Dr Diona Browner as PCP. Lvm for pt to call back ?

## 2021-11-07 ENCOUNTER — Other Ambulatory Visit: Payer: Self-pay | Admitting: Family Medicine

## 2021-11-07 NOTE — Telephone Encounter (Signed)
I have attempted without success to contact this patient by phone to I left a message on answering machine to make an appt  for follow up for refills .

## 2021-11-12 ENCOUNTER — Other Ambulatory Visit: Payer: Self-pay | Admitting: Family Medicine

## 2021-11-12 NOTE — Telephone Encounter (Signed)
Last office visit 01/28/21 for HTN & Anxiety.  Last refilled 08/14/21 for #180 with no refills.  No future appointments.

## 2021-11-17 ENCOUNTER — Other Ambulatory Visit: Payer: Self-pay | Admitting: Family Medicine

## 2021-11-19 NOTE — Telephone Encounter (Signed)
Pt called about a decline of this medication:

## 2021-11-24 ENCOUNTER — Encounter: Payer: Self-pay | Admitting: Family Medicine

## 2021-11-24 DIAGNOSIS — E1159 Type 2 diabetes mellitus with other circulatory complications: Secondary | ICD-10-CM

## 2021-11-24 MED ORDER — METFORMIN HCL ER 500 MG PO TB24: ORAL_TABLET | ORAL | 0 refills | Status: DC

## 2021-11-24 NOTE — Telephone Encounter (Signed)
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  Metformin refilled for 90 day supply.

## 2021-11-25 ENCOUNTER — Encounter: Payer: Self-pay | Admitting: Family Medicine

## 2021-11-25 ENCOUNTER — Ambulatory Visit (INDEPENDENT_AMBULATORY_CARE_PROVIDER_SITE_OTHER): Payer: No Typology Code available for payment source | Admitting: Family Medicine

## 2021-11-25 VITALS — BP 120/86 | HR 81 | Temp 98.0°F | Ht 68.5 in | Wt 280.1 lb

## 2021-11-25 DIAGNOSIS — M25511 Pain in right shoulder: Secondary | ICD-10-CM | POA: Diagnosis not present

## 2021-11-25 DIAGNOSIS — E1159 Type 2 diabetes mellitus with other circulatory complications: Secondary | ICD-10-CM

## 2021-11-25 DIAGNOSIS — N9089 Other specified noninflammatory disorders of vulva and perineum: Secondary | ICD-10-CM

## 2021-11-25 DIAGNOSIS — K648 Other hemorrhoids: Secondary | ICD-10-CM

## 2021-11-25 DIAGNOSIS — B372 Candidiasis of skin and nail: Secondary | ICD-10-CM | POA: Diagnosis not present

## 2021-11-25 MED ORDER — FLUCONAZOLE 150 MG PO TABS: 150.0000 mg | ORAL_TABLET | Freq: Once | ORAL | 0 refills | Status: DC | PRN

## 2021-11-25 MED ORDER — METFORMIN HCL ER 500 MG PO TB24: ORAL_TABLET | ORAL | 0 refills | Status: DC

## 2021-11-25 MED ORDER — PREDNISONE 20 MG PO TABS: 20.0000 mg | ORAL_TABLET | Freq: Every day | ORAL | 0 refills | Status: DC

## 2021-11-25 MED ORDER — HYDROCORTISONE ACETATE 25 MG RE SUPP: 25.0000 mg | Freq: Every day | RECTAL | 0 refills | Status: DC

## 2021-11-25 NOTE — Assessment & Plan Note (Signed)
Acute, encouraged patient to use barrier cream.

## 2021-11-25 NOTE — Assessment & Plan Note (Signed)
Acute, not improved with over-the-counter treatment.  We will start a course of rectal steroid suppositories x6 days.  If not improving will consider referral to GI.

## 2021-11-25 NOTE — Assessment & Plan Note (Signed)
Acute, perineal changes appear to be associated with possible Candida infection.  There was no improvement in rash on perineum with recent antibacterial treatment.

## 2021-11-25 NOTE — Patient Instructions (Addendum)
Start home PT for shoulder.    Apply ice/heat to shoulder. If not improving Complete prednisone taper and hold NSAIDs while on.  Call for referral to Dr. [facility] or Ortho if not improving in 2-4 weeks.   Take antifungal medication for rectal issue.  Start suppositories. Use barrier cream on perineum.  Keep up with water and fiber in diet to avoid constipation.  Call if not improving for referral to GI.

## 2021-11-25 NOTE — Progress Notes (Addendum)
Patient ID: Kathy Mccarthy, female    DOB: [birth year], 51 y.o.   MRN: [id number]  This visit was conducted in person.  BP 120/86   Pulse 81   Temp 98 F (36.7 C) (Oral)   Ht 5' 8.5" (1.74 m)   Wt 280 lb 1 oz (127 kg)   SpO2 95%   BMI 41.96 kg/m    CC:  Chief Complaint  Patient presents with   Shoulder Pain    Right   Hemorrhoids    Subjective:   HPI: Kathy Mccarthy is a 51 y.o. female presenting on 11/25/2021 for Shoulder Pain (Right) and Hemorrhoids   Right shoulder pain:  new onset in last week.. started after lunging for a remote.  Pain is off and on. Worse with picking up gallon jug. ROM okay. Pain with lifing something off shelf. No falls. Pain anterio-lateral.  She is using  diclofenac 75 mg BID  for neck and hips.   No numbness and weakness in arm, no radiation of pain  Hx of OA in cervical spine.      She has also been having issues  with hemorrhoids... painful itchy, wiping hurts. Was having constipation, now nml BMs every other days Sees blood in  stool, bright red small amount  [facility].. rectal fissue.. treated with antibiotics.Marland Kitchen no change.  Applying Aquapher and hemorroid cream Relevant past medical, surgical, family and social history reviewed and updated as indicated. Interim medical history since our last visit reviewed. Allergies and medications reviewed and updated. Outpatient Medications Prior to Visit  Medication Sig Dispense Refill   atorvastatin (LIPITOR) 40 MG tablet TAKE 1/2 TABLET BY MOUTH EVERY DAY 45 tablet 0   Calcium Citrate-Vitamin D (CALCIUM + D PO) Take 1 tablet by mouth daily.     cyclobenzaprine (FLEXERIL) 10 MG tablet TAKE 0.5-1 TABLETS (5-10 MG TOTAL) BY MOUTH AT BEDTIME. 30 tablet 1   diclofenac (VOLTAREN) 75 MG EC tablet TAKE 1 TABLET BY MOUTH TWICE A DAY 180 tablet 0   GLUCOSAMINE-CHONDROITIN PO Take 2 tablets by mouth daily.     levonorgestrel (MIRENA) 20 MCG/24HR IUD 1 each by Intrauterine route once.      metFORMIN (GLUCOPHAGE-XR) 500 MG 24 hr tablet TAKE 2 TABLETS BY MOUTH EVERY DAY WITH BREAKFAST 180 tablet 0   Multiple Vitamins-Minerals (MULTIVITAMIN WITH MINERALS) tablet Take 1 tablet by mouth daily.     triamcinolone (NASACORT) 55 MCG/ACT AERO nasal inhaler Place 1 spray into the nose daily.      valsartan-hydrochlorothiazide (DIOVAN-HCT) 160-25 MG tablet Take 1 tablet by mouth daily. 90 tablet 3   No facility-administered medications prior to visit.     Per HPI unless specifically indicated in ROS section below Review of Systems  Constitutional:  Negative for fatigue and fever.  HENT:  Negative for congestion.   Eyes:  Negative for pain.  Respiratory:  Negative for cough and shortness of breath.   Cardiovascular:  Negative for chest pain, palpitations and leg swelling.  Gastrointestinal:  Negative for abdominal pain.  Genitourinary:  Negative for dysuria and vaginal bleeding.  Musculoskeletal:  Negative for back pain.  Neurological:  Negative for syncope, light-headedness and headaches.  Psychiatric/Behavioral:  Negative for dysphoric mood.   Objective:  BP 120/86   Pulse 81   Temp 98 F (36.7 C) (Oral)   Ht 5' 8.5" (1.74 m)   Wt 280 lb 1 oz (127 kg)   SpO2 95%   BMI 41.96 kg/m  Wt Readings from Last 3 Encounters:  11/25/21 280 lb 1 oz (127 kg)  01/28/21 279 lb (126.6 kg)  09/24/20 273 lb 12 oz (124.2 kg)      Physical Exam Exam conducted with a chaperone present.  Constitutional:      General: She is not in acute distress.    Appearance: Normal appearance. She is well-developed. She is not ill-appearing or toxic-appearing.  HENT:     Head: Normocephalic.     Right Ear: Hearing, tympanic membrane, ear canal and external ear normal. Tympanic membrane is not erythematous, retracted or bulging.     Left Ear: Hearing, tympanic membrane, ear canal and external ear normal. Tympanic membrane is not erythematous, retracted or bulging.     Nose: No mucosal edema or  rhinorrhea.     Right Sinus: No maxillary sinus tenderness or frontal sinus tenderness.     Left Sinus: No maxillary sinus tenderness or frontal sinus tenderness.     Mouth/Throat:     Pharynx: Uvula midline.  Eyes:     General: Lids are normal. Lids are everted, no foreign bodies appreciated.     Conjunctiva/sclera: Conjunctivae normal.     Pupils: Pupils are equal, round, and reactive to light.  Neck:     Thyroid: No thyroid mass or thyromegaly.     Vascular: No carotid bruit.     Trachea: Trachea normal.  Cardiovascular:     Rate and Rhythm: Normal rate and regular rhythm.     Pulses: Normal pulses.     Heart sounds: Normal heart sounds, S1 normal and S2 normal. No murmur heard.   No friction rub. No gallop.  Pulmonary:     Effort: Pulmonary effort is normal. No tachypnea or respiratory distress.     Breath sounds: Normal breath sounds. No decreased breath sounds, wheezing, rhonchi or rales.  Abdominal:     General: Bowel sounds are normal.     Palpations: Abdomen is soft.     Tenderness: There is no abdominal tenderness.  Genitourinary:    Comments:  Fissures on perineum in in gluteal crease, erythematous tissue of perineum  External rectal tags and internal grade 1 hemorhoids.  Negative hemeoccult. Musculoskeletal:     Right shoulder: Tenderness present. No swelling, bony tenderness or crepitus. Normal range of motion.     Left shoulder: Normal.     Cervical back: Normal range of motion and neck supple.     Comments: Negative Neer' s, Neg Spurling  Tpp over lateral bursa   Skin:    General: Skin is warm and dry.     Findings: No rash.  Neurological:     Mental Status: She is alert.  Psychiatric:        Mood and Affect: Mood is not anxious or depressed.        Speech: Speech normal.        Behavior: Behavior normal. Behavior is cooperative.        Thought Content: Thought content normal.        Judgment: Judgment normal.    Procedure note: Anoscopy After  explaining the procedure, informed consent was obtained.   Using the anoscope instrument, anoscopy was carried out.  Proceeded to 2-3 cm.  Findings: normal mucosa without polyps, or tumors. Internal hemorrhoids noted, no biopsies taken, bowel prep was adequate, procedure well tolerated without complications.  No complications were encountered;  the procedure was well  tolerated.         Results for orders placed  or performed in visit on 10/01/20  HM MAMMOGRAPHY  Result Value Ref Range   HM Mammogram 0-4 Bi-Rad 0-4 Bi-Rad, Self Reported Normal  HM PAP SMEAR  Result Value Ref Range   HM Pap smear      Negative for Intraepithelial Lesions or malignancy.  Benign reactiive/reparative changes.  HPV Negative     COVID 19 screen:  No recent travel or known exposure to [place] The patient denies respiratory symptoms of COVID 19 at this time. The importance of social distancing was discussed today.   Assessment and Plan Problem List Items Addressed This Visit     Acute pain of right shoulder - Primary     Acute, worsening   Most likely bursitis vs tendonitis.  Treat with   home PT and ICe.  If not improving can complete a prednisone course.        Candidal skin infection    Acute, perineal changes appear to be associated with possible Candida infection.  There was no improvement in rash on perineum with recent antibacterial treatment.       Relevant Medications   fluconazole (DIFLUCAN) 150 MG tablet   Internal hemorrhoids    Acute, not improved with over-the-counter treatment.  We will start a course of rectal steroid suppositories x6 days.  If not improving will consider referral to GI.       Perineal fissure in female    Acute, encouraged patient to use barrier cream.       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 3 tabs at bedtime x 5 days, then 2 tabs x 3 days then 1 tab x 2 days then stop    Dispense:  23 tablet    Refill:  0   fluconazole (DIFLUCAN) 150 MG tablet    Sig: Take 1 tablet (150 mg total) by mouth once as needed for up to 1 dose. Repeat if not resolving.    Dispense:  2 tablet    Refill:  0   hydrocortisone (ANUSOL-HC) 25 MG suppository    Sig: Place 1 suppository (25 mg total) rectally daily.    Dispense:  6 suppository    Refill:  0   metFORMIN (GLUCOPHAGE-XR) 500 MG 24 hr tablet    Sig: TAKE 2 TABLETS BY MOUTH EVERY DAY WITH BREAKFAST    Dispense:  180 tablet    Refill:  0       Eliezer Lofts, MD

## 2021-11-25 NOTE — Telephone Encounter (Signed)
Please work in for [place] with fasting labs prior for Dr. Diona Browner in July.

## 2021-11-25 NOTE — Assessment & Plan Note (Addendum)
Acute, worsening   Most likely bursitis vs tendonitis.  Treat with   home PT and ICe.  If not improving can complete a prednisone course.

## 2021-11-26 ENCOUNTER — Other Ambulatory Visit: Payer: Self-pay | Admitting: Family Medicine

## 2021-12-22 LAB — HM DIABETES FOOT EXAM

## 2021-12-24 ENCOUNTER — Encounter: Payer: Self-pay | Admitting: Dermatology

## 2021-12-24 ENCOUNTER — Ambulatory Visit (INDEPENDENT_AMBULATORY_CARE_PROVIDER_SITE_OTHER): Payer: No Typology Code available for payment source | Admitting: Dermatology

## 2021-12-24 ENCOUNTER — Telehealth: Payer: Self-pay | Admitting: Family Medicine

## 2021-12-24 DIAGNOSIS — E1159 Type 2 diabetes mellitus with other circulatory complications: Secondary | ICD-10-CM

## 2021-12-24 DIAGNOSIS — L820 Inflamed seborrheic keratosis: Secondary | ICD-10-CM

## 2021-12-24 NOTE — Patient Instructions (Signed)
Cryotherapy Aftercare  Wash gently with soap and water everyday.   Apply Vaseline daily until healed.    Prior to procedure, discussed risks of blister formation, small wound, skin dyspigmentation, or rare scar following cryotherapy. Recommend Vaseline ointment to treated areas while healing.    Due to recent changes in healthcare laws, you may see results of your pathology and/or laboratory studies on MyChart before the doctors have had a chance to review them. We understand that in some cases there may be results that are confusing or concerning to you. Please understand that not all results are received at the same time and often the doctors may need to interpret multiple results in order to provide you with the best plan of care or course of treatment. Therefore, we ask that you please give [place] 2 business days to thoroughly review all your results before contacting the office for clarification. Should we see a critical lab result, you will be contacted sooner.   If You Need Anything After Your Visit  If you have any questions or concerns for your doctor, please call our main line at [phone] and press option 4 to reach your doctor's medical assistant. If no one answers, please leave a voicemail as directed and we will return your call as soon as possible. Messages left after 4 pm will be answered the following business day.   You may also send [place] a message via [place]. We typically respond to MyChart messages within 1-2 business days.  For prescription refills, please ask your pharmacy to contact our office. Our fax number is [phone].  If you have an urgent issue when the clinic is closed that cannot wait until the next business day, you can page your doctor at the number below.    Please note that while we do our best to be available for urgent issues outside of office hours, we are not available 24/7.   If you have an urgent issue and are unable to reach [place], you may choose to  seek medical care at your doctor's office, retail clinic, urgent care center, or emergency room.  If you have a medical emergency, please immediately call 911 or go to the emergency department.  Pager Numbers  - Dr. Nehemiah Massed: [phone]  - Dr. Laurence Ferrari: [phone]  - Dr. Nicole Kindred: [phone]  In the event of inclement weather, please call our main line at [phone] for an update on the status of any delays or closures.  Dermatology Medication Tips: Please keep the boxes that topical medications come in in order to help keep track of the instructions about where and how to use these. Pharmacies typically print the medication instructions only on the boxes and not directly on the medication tubes.   If your medication is too expensive, please contact our office at [phone] option 4 or send [place] a message through [place].   We are unable to tell what your co-pay for medications will be in advance as this is different depending on your insurance coverage. However, we may be able to find a substitute medication at lower cost or fill out paperwork to get insurance to cover a needed medication.   If a prior authorization is required to get your medication covered by your insurance company, please allow [place] 1-2 business days to complete this process.  Drug prices often vary depending on where the prescription is filled and some pharmacies may offer cheaper prices.  The website www.goodrx.com contains coupons for medications through different pharmacies. The prices  here do not account for what the cost may be with help from insurance (it may be cheaper with your insurance), but the website can give you the price if you did not use any insurance.  - You can print the associated coupon and take it with your prescription to the pharmacy.  - You may also stop by our office during regular business hours and pick up a GoodRx coupon card.  - If you need your prescription sent electronically to a  different pharmacy, notify our office through [facility] or by phone at [phone] option 4.     Si Usted Necesita Algo Despus de Su Visita  Tambin puede enviarnos un mensaje a travs de Pharmacist, community. Por lo general respondemos a los mensajes de MyChart en el transcurso de 1 a 2 das hbiles.  Para renovar recetas, por favor pida a su farmacia que se ponga en contacto con nuestra oficina. Harland Dingwall de fax es [place] [phone].  Si tiene un asunto urgente cuando la clnica est cerrada y que no puede esperar hasta el siguiente da hbil, puede llamar/localizar a su doctor(a) al nmero que aparece a continuacin.   Por favor, tenga en cuenta que aunque hacemos todo lo posible para estar disponibles para asuntos urgentes fuera del horario de [place], no estamos disponibles las 24 horas del da, los 7 das de la [place].   Si tiene un problema urgente y no puede comunicarse con nosotros, puede optar por buscar atencin mdica  en el consultorio de su doctor(a), en una clnica privada, en un centro de atencin urgente o en una sala de emergencias.  Si tiene Engineering geologist, por favor llame inmediatamente al 911 o vaya a la sala de emergencias.  Nmeros de bper  - Dr. Nehemiah Massed: [phone]  - Dra. Moye: [phone]  - Dra. Nicole Kindred: [phone]  En caso de inclemencias del [place], por favor llame a Johnsie Kindred principal al [phone] para una actualizacin sobre el [place] de cualquier retraso o cierre.  Consejos para la medicacin en dermatologa: Por favor, guarde las cajas en las que vienen los medicamentos de uso tpico para ayudarle a seguir las instrucciones sobre dnde y cmo usarlos. Las farmacias generalmente imprimen las instrucciones del medicamento slo en las cajas y no directamente en los tubos del [place].   Si su medicamento es muy caro, por favor, pngase en contacto con Zigmund Daniel llamando al [phone] y presione la opcin 4 o envenos un  mensaje a travs de Pharmacist, community.   No podemos decirle cul ser su copago por los medicamentos por adelantado ya que esto es diferente dependiendo de la cobertura de su seguro. Sin embargo, es posible que podamos encontrar un medicamento sustituto a Electrical engineer un formulario para que el seguro cubra el medicamento que se considera necesario.   Si se requiere una autorizacin previa para que su compaa de seguros [place] su medicamento, por favor permtanos de 1 a 2 das hbiles para completar este proceso.  Los precios de los medicamentos varan con frecuencia dependiendo del Environmental consultant de dnde se surte la receta y alguna farmacias pueden ofrecer precios ms baratos.  El sitio web www.goodrx.com tiene cupones para medicamentos de Airline pilot. Los precios aqu no tienen en cuenta lo que podra costar con la ayuda del seguro (puede ser ms barato con su seguro), pero el sitio web puede darle el precio si no utiliz Research scientist (physical sciences).  - Puede imprimir el cupn correspondiente y llevarlo con su receta a la  [place] puede pasar por nuestra oficina durante el horario de atencin regular y recoger una tarjeta de cupones de GoodRx.  - Si necesita que su receta se enve electrnicamente a una farmacia diferente, informe a nuestra oficina a travs de MyChart de [place] o por telfono llamando al [phone] y presione la opcin 4.  

## 2021-12-24 NOTE — Telephone Encounter (Signed)
-----   Message from Lerry Liner sent at 12/08/2021  1:03 PM EDT ----- Regarding: cpe labs Thursday December 25, 2021 CPE labs for December 25, 2021.  Thank you, Deb

## 2021-12-24 NOTE — Addendum Note (Signed)
Addended by: Alfonso Patten on: 12/24/2021 02:03 PM   Modules accepted: Level of Service

## 2021-12-24 NOTE — Progress Notes (Addendum)
   Follow-Up Visit   Subjective  Kathy Mccarthy is a 51 y.o. female who presents for the following: lesions (Has irritated seborrheic keratosis would like treated with liquid nitrogen. Irritated, itching, raised, rough).  The patient has spots, moles and lesions to be evaluated, some may be new or changing and the patient has concerns that these could be cancer.  The following portions of the chart were reviewed this encounter and updated as appropriate:  Tobacco  Allergies  Meds  Problems  Med Hx  Surg Hx  Fam Hx      Review of Systems: No other skin or systemic complaints except as noted in HPI or Assessment and Plan.   Objective  Well appearing patient in no apparent distress; mood and affect are within normal limits.  A focused examination was performed including face, arms, legs, torso. Relevant physical exam findings are noted in the Assessment and Plan.  right calf x6, right knee x1, right lat thigh x1, left post auricular x1, left calf x2, left pretibia x2, left lat thigh x2, right hand x3, left upper arm x2, left lat abdomen x2, left upper abd x2, left flank x2 (26) Erythematous keratotic or waxy stuck-on papule or plaque.   Assessment & Plan  Inflamed seborrheic keratosis (26) right calf x6, right knee x1, right lat thigh x1, left post auricular x1, left calf x2, left pretibia x2, left lat thigh x2, right hand x3, left upper arm x2, left lat abdomen x2, left upper abd x2, left flank x2  Symptomatic, irritating, patient would like treated.  Destruction of lesion - right calf x6, right knee x1, right lat thigh x1, left post auricular x1, left calf x2, left pretibia x2, left lat thigh x2, right hand x3, left upper arm x2, left lat abdomen x2, left upper abd x2, left flank x2  Destruction method: cryotherapy   Informed consent: discussed and consent obtained   Lesion destroyed using liquid nitrogen: Yes   Outcome: patient tolerated procedure well with no complications    Post-procedure details: wound care instructions given   Additional details:  Prior to procedure, discussed risks of blister formation, small wound, skin dyspigmentation, or rare scar following cryotherapy. Recommend Vaseline ointment to treated areas while healing.     Return if symptoms worsen or fail to improve.  I, Emelia Salisbury, CMA, am acting as scribe for Forest Gleason, MD.  Documentation: I have reviewed the above documentation for accuracy and completeness, and I agree with the above.  Forest Gleason, MD

## 2021-12-25 ENCOUNTER — Other Ambulatory Visit (INDEPENDENT_AMBULATORY_CARE_PROVIDER_SITE_OTHER): Payer: No Typology Code available for payment source

## 2021-12-25 DIAGNOSIS — E1159 Type 2 diabetes mellitus with other circulatory complications: Secondary | ICD-10-CM | POA: Diagnosis not present

## 2021-12-25 LAB — COMPREHENSIVE METABOLIC PANEL
ALT: 39 U/L — ABNORMAL HIGH (ref 0–35)
AST: 20 U/L (ref 0–37)
Albumin: 4.2 g/dL (ref 3.5–5.2)
Alkaline Phosphatase: 46 U/L (ref 39–117)
BUN: 18 mg/dL (ref 6–23)
CO2: 28 mEq/L (ref 19–32)
Calcium: 9.7 mg/dL (ref 8.4–10.5)
Chloride: 99 mEq/L (ref 96–112)
Creatinine, Ser: 0.73 mg/dL (ref 0.40–1.20)
GFR: 94.37 mL/min (ref >60.00)
Glucose, Bld: 252 mg/dL — ABNORMAL HIGH (ref 70–99)
Potassium: 4.1 mEq/L (ref 3.5–5.1)
Sodium: 136 mEq/L (ref 135–145)
Total Bilirubin: 1.2 mg/dL (ref 0.2–1.2)
Total Protein: 6.6 g/dL (ref 6.0–8.3)

## 2021-12-25 LAB — HEMOGLOBIN A1C: Hgb A1c MFr Bld: 9.8 % — ABNORMAL HIGH (ref 4.6–6.5)

## 2021-12-25 LAB — LIPID PANEL
Cholesterol: 266 mg/dL — ABNORMAL HIGH (ref 0–200)
HDL: 34.9 mg/dL — ABNORMAL LOW (ref >39.00)
Total CHOL/HDL Ratio: 8
Triglycerides: 1186 mg/dL — ABNORMAL HIGH (ref 0.0–149.0)

## 2021-12-25 LAB — LDL CHOLESTEROL, DIRECT: Direct LDL: 60 mg/dL

## 2021-12-30 ENCOUNTER — Ambulatory Visit (INDEPENDENT_AMBULATORY_CARE_PROVIDER_SITE_OTHER): Payer: No Typology Code available for payment source | Admitting: Family Medicine

## 2021-12-30 ENCOUNTER — Encounter: Payer: Self-pay | Admitting: Family Medicine

## 2021-12-30 VITALS — BP 120/84 | HR 104 | Temp 98.1°F | Ht 68.5 in | Wt 277.5 lb

## 2021-12-30 DIAGNOSIS — Z0000 Encounter for general adult medical examination without abnormal findings: Secondary | ICD-10-CM

## 2021-12-30 DIAGNOSIS — I152 Hypertension secondary to endocrine disorders: Secondary | ICD-10-CM

## 2021-12-30 DIAGNOSIS — E785 Hyperlipidemia, unspecified: Secondary | ICD-10-CM

## 2021-12-30 DIAGNOSIS — Z6841 Body Mass Index (BMI) 40.0 and over, adult: Secondary | ICD-10-CM

## 2021-12-30 DIAGNOSIS — E1159 Type 2 diabetes mellitus with other circulatory complications: Secondary | ICD-10-CM | POA: Diagnosis not present

## 2021-12-30 DIAGNOSIS — Z1211 Encounter for screening for malignant neoplasm of colon: Secondary | ICD-10-CM

## 2021-12-30 DIAGNOSIS — E1169 Type 2 diabetes mellitus with other specified complication: Secondary | ICD-10-CM | POA: Diagnosis not present

## 2021-12-30 MED ORDER — ATORVASTATIN CALCIUM 40 MG PO TABS: 20.0000 mg | ORAL_TABLET | Freq: Every day | ORAL | 3 refills | Status: AC

## 2021-12-30 MED ORDER — VALSARTAN-HYDROCHLOROTHIAZIDE 160-25 MG PO TABS: 1.0000 | ORAL_TABLET | Freq: Every day | ORAL | 3 refills | Status: AC

## 2021-12-30 MED ORDER — METFORMIN HCL ER 500 MG PO TB24: ORAL_TABLET | ORAL | 3 refills | Status: AC

## 2021-12-30 MED ORDER — BLOOD GLUCOSE MONITOR KIT: PACK | 0 refills | Status: AC

## 2021-12-30 NOTE — Assessment & Plan Note (Signed)
Poor control... increase metformin XR to 500 mg  2 tabs twice daily. Start checking blood suagr daily for control evaluation.  Re-eval with labs in 3 months... [facility].. rx given today.

## 2021-12-30 NOTE — Assessment & Plan Note (Signed)
Encouraged exercise, weight loss, healthy eating habits. Discussed GLP1 as option.. will reconsider once she has steady insurance for follow ups.

## 2021-12-30 NOTE — Assessment & Plan Note (Signed)
Stable, chronic.  Continue current medication.   2 tabs daily of valsartan HCTZ 80/12.5 mg daily

## 2021-12-30 NOTE — Patient Instructions (Addendum)
Set up yearly eye exam for diabetes and have the opthalmologist send [place] a copy of the evaluation for the chart.  Increase the metformin to 500 mg 12 in AM and one in OM... if tolerate it after 1-2 weeks, increase the dose to 2 in AM and 2 PM.  Decreased carbohydrates as able, start walking. 3-4 times a week.   Check blood sugar fasting .Marland Kitchen Goal < 120,  occasionally check 2 hours after  a meal goal < 180.  Record blood sugars and bring to next appointment.   Pik up stool testing in lab for colon cancer.

## 2021-12-30 NOTE — Assessment & Plan Note (Signed)
Triglycerides very high likely due to poorly controlled diabetes. Will focua on getting sugar downa nd recheck in 3 months.  LDL at goal < 70 on atorvastatin 20 mg daily.

## 2021-12-30 NOTE — Progress Notes (Signed)
Patient ID: Kathy Mccarthy, female    DOB: 10/18/1970, 51 y.o.   MRN: [id number]  This visit was conducted in person.  BP 120/84   Pulse (!) 104   Temp 98.1 F (36.7 C) (Oral)   Ht 5' 8.5" (1.74 m)   Wt 277 lb 8 oz (125.9 kg)   SpO2 94%   BMI 41.58 kg/m    CC:  Chief Complaint  Patient presents with   Annual Exam    Subjective:   HPI: Kathy Mccarthy is a 51 y.o. female presenting on 12/30/2021 for Annual Exam   1 week ago started with dull headache, fatigue.  Diabetes: Poor control despite metformin XR 500 mg 2 tablets daily. Significantly worse control compared to 1 year ago. Lab Results  Component Value Date   HGBA1C 9.8 (H) 12/25/2021  Using medications without difficulties: none Hypoglycemic episodes: Hyperglycemic episodes: Feet problems: none Blood Sugars averaging:not checking eye exam within last year: none  Hypertension:  Good control on valsartan hydrochlorothiazide daily BP Readings from Last 3 Encounters:  12/30/21 120/84  11/25/21 120/86  01/28/21 126/74  Using medication without problems or lightheadedness:  none Chest pain with exertion: none Edema: none Short of breath: none Average home BPs: Other issues:  Elevated Cholesterol: Poor control despite atorvastatin  1/2 tablet 40 mg daily LDL is at goal less than 70 but triglycerides are very high given elevated glucose. Lab Results  Component Value Date   CHOL 266 (H) 12/25/2021   HDL 34.90 (L) 12/25/2021   LDLDIRECT 60.0 12/25/2021   TRIG (H) 12/25/2021    1186.0 Triglyceride is over 400; calculations on Lipids are invalid.   CHOLHDL 8 12/25/2021  Using medications without problems: Muscle aches:  Diet compliance: poor Exercise: 3 days a week Other complaints:        Relevant past medical, surgical, family and social history reviewed and updated as indicated. Interim medical history since our last visit reviewed. Allergies and medications reviewed and updated. Outpatient  Medications Prior to Visit  Medication Sig Dispense Refill   atorvastatin (LIPITOR) 40 MG tablet TAKE 1/2 TABLET BY MOUTH EVERY DAY 45 tablet 0   Calcium Citrate-Vitamin D (CALCIUM + D PO) Take 1 tablet by mouth daily.     cyclobenzaprine (FLEXERIL) 10 MG tablet TAKE 0.5-1 TABLETS (5-10 MG TOTAL) BY MOUTH AT BEDTIME. 30 tablet 1   diclofenac (VOLTAREN) 75 MG EC tablet TAKE 1 TABLET BY MOUTH TWICE A DAY 180 tablet 0   GLUCOSAMINE-CHONDROITIN PO Take 2 tablets by mouth daily.     levonorgestrel (MIRENA) 20 MCG/24HR IUD 1 each by Intrauterine route once.     metFORMIN (GLUCOPHAGE-XR) 500 MG 24 hr tablet TAKE 2 TABLETS BY MOUTH EVERY DAY WITH BREAKFAST 180 tablet 0   Multiple Vitamins-Minerals (MULTIVITAMIN WITH MINERALS) tablet Take 1 tablet by mouth daily.     triamcinolone (NASACORT) 55 MCG/ACT AERO nasal inhaler Place 1 spray into the nose daily.      valsartan-hydrochlorothiazide (DIOVAN-HCT) 160-25 MG tablet Take 1 tablet by mouth daily. 90 tablet 3   fluconazole (DIFLUCAN) 150 MG tablet Take 1 tablet (150 mg total) by mouth once as needed for up to 1 dose. Repeat if not resolving. 2 tablet 0   hydrocortisone (ANUSOL-HC) 25 MG suppository Place 1 suppository (25 mg total) rectally daily. 6 suppository 0   predniSONE (DELTASONE) 20 MG tablet Take 1 tablet (20 mg total) by mouth daily with breakfast. 3 tabs at bedtime x 5 days, then  2 tabs x 3 days then 1 tab x 2 days then stop 23 tablet 0   No facility-administered medications prior to visit.     Per HPI unless specifically indicated in ROS section below Review of Systems Objective:  BP 120/84   Pulse (!) 104   Temp 98.1 F (36.7 C) (Oral)   Ht 5' 8.5" (1.74 m)   Wt 277 lb 8 oz (125.9 kg)   SpO2 94%   BMI 41.58 kg/m   Wt Readings from Last 3 Encounters:  12/30/21 277 lb 8 oz (125.9 kg)  11/25/21 280 lb 1 oz (127 kg)  01/28/21 279 lb (126.6 kg)      Physical Exam    Diabetic foot exam: Normal inspection No skin breakdown No  calluses  Normal DP pulses Normal sensation to light touch and monofilament Nails normal  Results for orders placed or performed in visit on 12/25/21  Comprehensive metabolic panel  Result Value Ref Range   Sodium 136 135 - 145 mEq/L   Potassium 4.1 3.5 - 5.1 mEq/L   Chloride 99 96 - 112 mEq/L   CO2 28 19 - 32 mEq/L   Glucose, Bld 252 (H) 70 - 99 mg/dL   BUN 18 6 - 23 mg/dL   Creatinine, Ser 0.73 0.40 - 1.20 mg/dL   Total Bilirubin 1.2 0.2 - 1.2 mg/dL   Alkaline Phosphatase 46 39 - 117 U/L   AST 20 0 - 37 U/L   ALT 39 (H) 0 - 35 U/L   Total Protein 6.6 6.0 - 8.3 g/dL   Albumin 4.2 3.5 - 5.2 g/dL   GFR 94.37 >60.00 mL/min   Calcium 9.7 8.4 - 10.5 mg/dL  Hemoglobin A1c  Result Value Ref Range   Hgb A1c MFr Bld 9.8 (H) 4.6 - 6.5 %  Lipid panel  Result Value Ref Range   Cholesterol 266 (H) 0 - 200 mg/dL   Triglycerides (H) 0.0 - 149.0 mg/dL    1186.0 Triglyceride is over 400; calculations on Lipids are invalid.   HDL 34.90 (L) >39.00 mg/dL   Total CHOL/HDL Ratio 8   LDL cholesterol, direct  Result Value Ref Range   Direct LDL 60.0 mg/dL     COVID 19 screen:  No recent travel or known exposure to COVID19 The patient denies respiratory symptoms of COVID 19 at this time. The importance of social distancing was discussed today.   Assessment and Plan The patient's preventative maintenance and recommended screening tests for an annual wellness exam were reviewed in full today. Brought up to date unless services declined.  Counselled on the importance of diet, exercise, and its role in overall health and mortality. The patient's FH and SH was reviewed, including their home life, tobacco status, and drug and alcohol status.    Vaccines: . COVID 19  X 2, shingrix done Pap/DVE:  Sees GYN .Marland KitchenMarland Kitchen5/2021 Mammo:    Scheduled AUNT WITH [facility] CANCER..  10/2019 Colon:  Negative stool cards 2022 Smoking Status:nonsmoker ETOH/ drug ZOX:WRUE/AVWU  HIV screen:   refused  Problem List  Items Addressed This Visit     Hyperlipidemia associated with type 2 diabetes mellitus ([place])     Triglycerides very high likely due to poorly controlled diabetes. Will focua on getting sugar downa nd recheck in 3 months.  LDL at goal < 70 on atorvastatin 20 mg daily.      Relevant Medications   metFORMIN (GLUCOPHAGE-XR) 500 MG 24 hr tablet   atorvastatin (LIPITOR) 40 MG tablet  valsartan-hydrochlorothiazide (DIOVAN-HCT) 160-25 MG tablet   Hypertension associated with diabetes (HCC)    Stable, chronic.  Continue current medication.   2 tabs daily of valsartan HCTZ 80/12.5 mg daily      Relevant Medications   metFORMIN (GLUCOPHAGE-XR) 500 MG 24 hr tablet   atorvastatin (LIPITOR) 40 MG tablet   valsartan-hydrochlorothiazide (DIOVAN-HCT) 160-25 MG tablet   Morbid obesity with BMI of 40.0-44.9, adult (HCC)    Encouraged exercise, weight loss, healthy eating habits. Discussed GLP1 as option.. will reconsider once she has steady insurance for follow ups.       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 increase metformin XR to 500 mg  2 tabs twice daily. Start checking blood suagr daily for control evaluation.  Re-eval with labs in 3 months... [facility].. rx given today.      Relevant Medications   metFORMIN (GLUCOPHAGE-XR) 500 MG 24 hr tablet   atorvastatin (LIPITOR) 40 MG tablet   valsartan-hydrochlorothiazide (DIOVAN-HCT) 160-25 MG tablet   Other Visit Diagnoses     Routine general medical examination at a health care facility    -  Primary   Colon cancer screening       Relevant Orders   Fecal occult blood, imunochemical       Eliezer Lofts, MD

## 2022-01-01 ENCOUNTER — Encounter: Payer: Self-pay | Admitting: Family Medicine

## 2022-02-10 ENCOUNTER — Other Ambulatory Visit: Payer: Self-pay | Admitting: Family Medicine

## 2022-02-10 NOTE — Telephone Encounter (Signed)
Last office visit 12/30/21 for CPE.  Last refilled 11/12/21 for #180 with no refills.  Next Appt: 07/03/22 for DM.

## 2022-05-11 ENCOUNTER — Other Ambulatory Visit: Payer: Self-pay | Admitting: Family Medicine

## 2022-05-11 NOTE — Telephone Encounter (Signed)
Last office visit 12/30/21 for CPE.  Last refilled 02/10/2022 for #180 with no refills.  Next Appt: 07/03/2022 for DM.

## 2022-06-10 ENCOUNTER — Other Ambulatory Visit: Payer: Self-pay | Admitting: Family Medicine

## 2022-06-10 DIAGNOSIS — E1159 Type 2 diabetes mellitus with other circulatory complications: Secondary | ICD-10-CM

## 2022-07-03 ENCOUNTER — Ambulatory Visit: Payer: No Typology Code available for payment source | Admitting: Family Medicine

## 2022-08-12 ENCOUNTER — Other Ambulatory Visit: Payer: Self-pay | Admitting: Family Medicine

## 2022-08-12 NOTE — Telephone Encounter (Signed)
Last office visit 12/30/21 for CPE.  Last refilled 05/11/2022 for #180 with no refills.  No future appointments.

## 2022-10-29 ENCOUNTER — Other Ambulatory Visit: Payer: Self-pay | Admitting: Family Medicine

## 2022-11-02 ENCOUNTER — Other Ambulatory Visit: Payer: Self-pay | Admitting: Family Medicine

## 2022-11-16 ENCOUNTER — Other Ambulatory Visit: Payer: Self-pay | Admitting: Family Medicine

## 2022-11-20 ENCOUNTER — Other Ambulatory Visit: Payer: Self-pay | Admitting: Family Medicine

## 2022-12-24 ENCOUNTER — Other Ambulatory Visit: Payer: Self-pay | Admitting: Family Medicine

## 2022-12-24 DIAGNOSIS — E1159 Type 2 diabetes mellitus with other circulatory complications: Secondary | ICD-10-CM
